# Patient Record
Sex: Male | Born: 1940 | Race: White | Hispanic: No | Marital: Married | Smoking: Former smoker
Health system: Southern US, Community
[De-identification: ages and names within clinical notes are randomized; demographics above are authoritative.]

## PROBLEM LIST (undated history)

## (undated) DIAGNOSIS — J45909 Unspecified asthma, uncomplicated: Secondary | ICD-10-CM

## (undated) DIAGNOSIS — K08109 Complete loss of teeth, unspecified cause, unspecified class: Secondary | ICD-10-CM

## (undated) DIAGNOSIS — N189 Chronic kidney disease, unspecified: Secondary | ICD-10-CM

## (undated) DIAGNOSIS — N183 Chronic kidney disease, stage 3 unspecified: Secondary | ICD-10-CM

## (undated) DIAGNOSIS — E119 Type 2 diabetes mellitus without complications: Secondary | ICD-10-CM

## (undated) DIAGNOSIS — Z8701 Personal history of pneumonia (recurrent): Secondary | ICD-10-CM

## (undated) DIAGNOSIS — M1990 Unspecified osteoarthritis, unspecified site: Secondary | ICD-10-CM

## (undated) DIAGNOSIS — R350 Frequency of micturition: Secondary | ICD-10-CM

## (undated) DIAGNOSIS — H9190 Unspecified hearing loss, unspecified ear: Secondary | ICD-10-CM

## (undated) DIAGNOSIS — K219 Gastro-esophageal reflux disease without esophagitis: Secondary | ICD-10-CM

## (undated) DIAGNOSIS — Z87442 Personal history of urinary calculi: Secondary | ICD-10-CM

## (undated) DIAGNOSIS — J449 Chronic obstructive pulmonary disease, unspecified: Secondary | ICD-10-CM

## (undated) DIAGNOSIS — R0602 Shortness of breath: Secondary | ICD-10-CM

## (undated) DIAGNOSIS — Z972 Presence of dental prosthetic device (complete) (partial): Secondary | ICD-10-CM

## (undated) DIAGNOSIS — I10 Essential (primary) hypertension: Secondary | ICD-10-CM

## (undated) DIAGNOSIS — H409 Unspecified glaucoma: Secondary | ICD-10-CM

## (undated) HISTORY — PX: JOINT REPLACEMENT: SHX530

---

## 1993-07-15 DIAGNOSIS — Z87442 Personal history of urinary calculi: Secondary | ICD-10-CM

## 1993-07-15 HISTORY — DX: Personal history of urinary calculi: Z87.442

## 2012-12-13 HISTORY — PX: KNEE ARTHROPLASTY: SHX992

## 2014-03-14 ENCOUNTER — Other Ambulatory Visit: Payer: Self-pay | Admitting: Orthopedic Surgery

## 2014-04-08 ENCOUNTER — Ambulatory Visit (HOSPITAL_COMMUNITY)
Admission: RE | Admit: 2014-04-08 | Discharge: 2014-04-08 | Disposition: A | Discharge status: Home / Self Care | Payer: Medicare Other | Source: Ambulatory Visit | Attending: Orthopedic Surgery | Admitting: Orthopedic Surgery

## 2014-04-08 ENCOUNTER — Encounter (HOSPITAL_COMMUNITY): Payer: Self-pay

## 2014-04-08 ENCOUNTER — Encounter (HOSPITAL_COMMUNITY)
Admission: RE | Admit: 2014-04-08 | Discharge: 2014-04-08 | Disposition: A | Discharge status: Home / Self Care | Payer: Medicare Other | Source: Ambulatory Visit | Attending: Orthopedic Surgery | Admitting: Orthopedic Surgery

## 2014-04-08 DIAGNOSIS — I10 Essential (primary) hypertension: Secondary | ICD-10-CM | POA: Diagnosis not present

## 2014-04-08 DIAGNOSIS — Z01818 Encounter for other preprocedural examination: Secondary | ICD-10-CM | POA: Insufficient documentation

## 2014-04-08 HISTORY — DX: Type 2 diabetes mellitus without complications: E11.9

## 2014-04-08 HISTORY — DX: Unspecified hearing loss, unspecified ear: H91.90

## 2014-04-08 HISTORY — DX: Personal history of pneumonia (recurrent): Z87.01

## 2014-04-08 HISTORY — DX: Chronic obstructive pulmonary disease, unspecified: J44.9

## 2014-04-08 HISTORY — DX: Essential (primary) hypertension: I10

## 2014-04-08 HISTORY — DX: Complete loss of teeth, unspecified cause, unspecified class: K08.109

## 2014-04-08 HISTORY — DX: Unspecified glaucoma: H40.9

## 2014-04-08 HISTORY — DX: Gastro-esophageal reflux disease without esophagitis: K21.9

## 2014-04-08 HISTORY — DX: Personal history of urinary calculi: Z87.442

## 2014-04-08 HISTORY — DX: Unspecified asthma, uncomplicated: J45.909

## 2014-04-08 HISTORY — DX: Chronic kidney disease, stage 3 (moderate): N18.3

## 2014-04-08 HISTORY — DX: Shortness of breath: R06.02

## 2014-04-08 HISTORY — DX: Chronic kidney disease, unspecified: N18.9

## 2014-04-08 HISTORY — DX: Complete loss of teeth, unspecified cause, unspecified class: Z97.2

## 2014-04-08 HISTORY — DX: Frequency of micturition: R35.0

## 2014-04-08 HISTORY — DX: Unspecified osteoarthritis, unspecified site: M19.90

## 2014-04-08 HISTORY — DX: Chronic kidney disease, stage 3 unspecified: N18.30

## 2014-04-08 LAB — CBC WITH DIFFERENTIAL/PLATELET
Basophils Absolute: 0 10*3/uL (ref 0.0–0.1)
Basophils Relative: 1 % (ref 0–1)
Eosinophils Absolute: 0.2 10*3/uL (ref 0.0–0.7)
Eosinophils Relative: 2 % (ref 0–5)
HEMATOCRIT: 40.7 % (ref 39.0–52.0)
HEMOGLOBIN: 13.3 g/dL (ref 13.0–17.0)
LYMPHS PCT: 26 % (ref 12–46)
Lymphs Abs: 2 10*3/uL (ref 0.7–4.0)
MCH: 29.4 pg (ref 26.0–34.0)
MCHC: 32.7 g/dL (ref 30.0–36.0)
MCV: 90 fL (ref 78.0–100.0)
MONO ABS: 0.6 10*3/uL (ref 0.1–1.0)
MONOS PCT: 7 % (ref 3–12)
Neutro Abs: 4.9 10*3/uL (ref 1.7–7.7)
Neutrophils Relative %: 64 % (ref 43–77)
Platelets: 274 10*3/uL (ref 150–400)
RBC: 4.52 MIL/uL (ref 4.22–5.81)
RDW: 12.6 % (ref 11.5–15.5)
WBC: 7.6 10*3/uL (ref 4.0–10.5)

## 2014-04-08 LAB — COMPREHENSIVE METABOLIC PANEL
ALT: 17 U/L (ref 0–53)
ANION GAP: 13 (ref 5–15)
AST: 19 U/L (ref 0–37)
Albumin: 3.4 g/dL — ABNORMAL LOW (ref 3.5–5.2)
Alkaline Phosphatase: 50 U/L (ref 39–117)
BILIRUBIN TOTAL: 0.3 mg/dL (ref 0.3–1.2)
BUN: 28 mg/dL — AB (ref 6–23)
CHLORIDE: 100 meq/L (ref 96–112)
CO2: 26 meq/L (ref 19–32)
CREATININE: 1.63 mg/dL — AB (ref 0.50–1.35)
Calcium: 9.9 mg/dL (ref 8.4–10.5)
GFR calc Af Amer: 47 mL/min — ABNORMAL LOW (ref >90)
GFR, EST NON AFRICAN AMERICAN: 40 mL/min — AB (ref >90)
GLUCOSE: 171 mg/dL — AB (ref 70–99)
Potassium: 5.4 mEq/L — ABNORMAL HIGH (ref 3.7–5.3)
Sodium: 139 mEq/L (ref 137–147)
Total Protein: 7.8 g/dL (ref 6.0–8.3)

## 2014-04-08 LAB — SURGICAL PCR SCREEN
MRSA, PCR: NEGATIVE
Staphylococcus aureus: NEGATIVE

## 2014-04-08 LAB — URINALYSIS, ROUTINE W REFLEX MICROSCOPIC
BILIRUBIN URINE: NEGATIVE
Glucose, UA: 100 mg/dL — AB
HGB URINE DIPSTICK: NEGATIVE
Ketones, ur: NEGATIVE mg/dL
Leukocytes, UA: NEGATIVE
Nitrite: NEGATIVE
SPECIFIC GRAVITY, URINE: 1.019 (ref 1.005–1.030)
UROBILINOGEN UA: 0.2 mg/dL (ref 0.0–1.0)
pH: 5.5 (ref 5.0–8.0)

## 2014-04-08 LAB — URINE MICROSCOPIC-ADD ON

## 2014-04-08 LAB — PROTIME-INR
INR: 1.01 (ref 0.00–1.49)
PROTHROMBIN TIME: 13.3 s (ref 11.6–15.2)

## 2014-04-08 LAB — APTT: APTT: 35 s (ref 24–37)

## 2014-04-08 NOTE — Progress Notes (Signed)
04/08/14 1155  OBSTRUCTIVE SLEEP APNEA  Have you ever been diagnosed with sleep apnea through a sleep study? No  Do you snore loudly (loud enough to be heard through closed doors)?  0  Do you often feel tired, fatigued, or sleepy during the daytime? 0  Has anyone observed you stop breathing during your sleep? 0  Do you have, or are you being treated for high blood pressure? 1  BMI more than 35 kg/m2? 0  Age over 72 years old? 1  Neck circumference greater than 40 cm/16 inches? 1  Gender: 1  Obstructive Sleep Apnea Score 4  Score 4 or greater  (Dr Lorelei Pont)

## 2014-04-08 NOTE — Pre-Procedure Instructions (Signed)
Seth Romero  04/08/2014   Your procedure is scheduled on:  Monday, October 5  Report to [facility] Admitting at 0530 AM.  Call this number if you have problems the morning of surgery: [phone]   Remember:   Do not eat food or drink liquids after midnight.Sunday night   Take these medicines the morning of surgery with A SIP OF WATER: amlodipine,hydralazine, metoprolol. Omeprazole, tramadol if needed, eye drops, use inhaler if needed   Do not wear jewelry, make-up or nail polish.  Do not wear lotions, powders, or perfumes. You may wear deodorant.  Do not shave 48 hours prior to surgery. Men may shave face and neck.  Do not bring valuables to the hospital.  [facility] is not responsible   for any belongings or valuables.               Contacts, dentures or bridgework may not be worn into surgery.  Leave suitcase in the car. After surgery it may be brought to your room.  For patients admitted to the hospital, discharge time is determined by your   treatment team.               Patients discharged the day of surgery will not be allowed to drive  home.  Name and phone number of your driver: Mary Dudzik, spouse, [phone]    Special Instructions: [facility] - Preparing for Surgery  Before surgery, you can play an important role.  Because skin is not sterile, your skin needs to be as free of germs as possible.  You can reduce the number of germs on you skin by washing with CHG (chlorahexidine gluconate) soap before surgery.  CHG is an antiseptic cleaner which kills germs and bonds with the skin to continue killing germs even after washing.  Please DO NOT use if you have an allergy to CHG or antibacterial soaps.  If your skin becomes reddened/irritated stop using the CHG and inform your nurse when you arrive at Short Stay.  Do not shave (including legs and underarms) for at least 48 hours prior to the first CHG shower.  You may shave your face.  Please follow these  instructions carefully:   1.  Shower with CHG Soap the night before surgery and the  morning of Surgery.  2.  If you choose to wash your hair, wash your hair first as usual with your   normal shampoo.  3.  After you shampoo, rinse your hair and body thoroughly to remove the Shampoo.  4.  Use CHG as you would any other liquid soap.  You can apply chg directly   to the skin and wash gently with scrungie or a clean washcloth.  5.  Apply the CHG Soap to your body ONLY FROM THE NECK DOWN.  Do not use on open wounds or open sores.  Avoid contact with your eyes,    ears, mouth and genitals (private parts).  Wash genitals (private parts)  with your normal soap.  6.  Wash thoroughly, paying special attention to the area where your surgery  will be performed.  7.  Thoroughly rinse your body with warm water from the neck down.  8.  DO NOT shower/wash with your normal soap after using and rinsing off    the CHG Soap.  9.  Pat yourself dry with a clean towel.            10 .  Wear clean pajamas.  11.  Place clean sheets on your bed the night of your first shower and do not sleep with pets.  Day of Surgery  Do not apply any lotions/deoderants the morning of surgery.  Please wear clean clothes to the hospital/surgery center.     Please read over the following fact sheets that you were given: Pain Booklet, Coughing and Deep Breathing, Total Joint Packet and Surgical Site Infection Prevention

## 2014-04-08 NOTE — Progress Notes (Signed)
EKG, Stress test requested from [facility] Records re : Stage 3 kidney disease requested from Dr Kinnie Scales in [place] Records from PCP, Dr Stanton Kidney requested  For medical clearance

## 2014-04-09 LAB — URINE CULTURE

## 2014-04-11 ENCOUNTER — Encounter (HOSPITAL_COMMUNITY): Payer: Self-pay

## 2014-04-11 NOTE — Progress Notes (Addendum)
Anesthesia Chart Review:  Patient is a 73 year old male scheduled for right TKA on 04/18/14 by Dr. Sherlean Foot.  History includes HTN, CKD stage III (Dr. Rise Paganini in [place]), former smoker (still uses snuff), DM2, arthritis, glaucoma, GERD, hearing impaired, nephrolithiasis, left TKA '14. PCP is Dr. Lorelei Pont. Last office notes are still pending, but I did receive signed clearance notes from Dr. Kinnie Scales and Dr. Leary Roca.  EKG on 04/08/14 showed: NSR, cannot rule out anterior infarct (age undetermined). Currently, no comparison EKGs are available.  Echo on 10/12/13 [facility] [place]) showed: Mild concentric LVH and normal systolic function, EF 55-60%.  Normal RV systolic function. Moderate pulmonary hypertension. No major valvular abnormalities noted (trace MR, trace-mild TR.   Carotid duplex on 10/12/13 [facility] [place]) showed: intimal thickening with < 50% stenosis in the bilateral internal carotid systems.  Bilateral vertebrals demonstrate antegrade flow.    CXR on 04/08/14 showed: Evidence of a degree of underlying emphysematous change. No edema or consolidation. Atherosclerotic change in aorta.  Preoperative labs noted.  K 5.4, BUN/Cr 28/1.63. Currently, no comparison labs are available, but patient with known CKD stage III--nephrology records are pending. Glucose 171 (medical clearance note indicates that his last A1C was 6.6). CBC with diff, PT/PTT WNL. Urine culture showed insignificant growth. He needs T&S on the day of surgery since this is a TKA.  I'll leave chart for nurse follow-up regarding PCP and nephrology records and ask them to have me review once received.  However, based on the two clearance notes and currently available records, I anticipate that he can proceed as planned.  Velna Ochs [facility] Short Stay Center/Anesthesiology Phone [phone] 04/11/2014 12:43 PM  Addendum: Records from Dr. Kinnie Scales reviewed.  BUN/CR were 30/1.68 on  03/02/14, so most recent results seen stable.  Velna Ochs [facility] Short Stay Center/Anesthesiology Phone [phone] 04/11/2014 6:32 PM

## 2014-04-17 MED ORDER — CHLORHEXIDINE GLUCONATE 4 % EX LIQD
60.0000 mL | Freq: Once | CUTANEOUS | Status: DC
  Filled 2014-04-17: qty 60

## 2014-04-17 MED ORDER — BUPIVACAINE LIPOSOME 1.3 % IJ SUSP
20.0000 mL | Freq: Once | INTRAMUSCULAR | Status: DC
  Filled 2014-04-17: qty 20

## 2014-04-17 MED ORDER — SODIUM CHLORIDE 0.9 % IV SOLN
1000.0000 mg | INTRAVENOUS | Status: AC
  Administered 2014-04-18: 1000 mg via INTRAVENOUS
  Filled 2014-04-17: qty 10

## 2014-04-17 MED ORDER — SODIUM CHLORIDE 0.9 % IV SOLN: INTRAVENOUS | Status: DC

## 2014-04-17 MED ORDER — CEFAZOLIN SODIUM-DEXTROSE 2-3 GM-% IV SOLR
2.0000 g | INTRAVENOUS | Status: AC
  Administered 2014-04-18: 2 g via INTRAVENOUS
  Filled 2014-04-17: qty 50

## 2014-04-18 ENCOUNTER — Encounter (HOSPITAL_COMMUNITY): Payer: Medicare Other | Admitting: Vascular Surgery

## 2014-04-18 ENCOUNTER — Inpatient Hospital Stay (HOSPITAL_COMMUNITY)
Admission: RE | Admit: 2014-04-18 | Discharge: 2014-04-19 | DRG: 470 | Disposition: A | Discharge status: Home / Self Care | Payer: Medicare Other | Source: Ambulatory Visit | Attending: Orthopedic Surgery | Admitting: Orthopedic Surgery

## 2014-04-18 ENCOUNTER — Encounter (HOSPITAL_COMMUNITY)
Admission: RE | Disposition: A | Discharge status: Home / Self Care | Payer: Self-pay | Source: Ambulatory Visit | Attending: Orthopedic Surgery

## 2014-04-18 ENCOUNTER — Encounter (HOSPITAL_COMMUNITY): Payer: Self-pay | Admitting: *Deleted

## 2014-04-18 ENCOUNTER — Inpatient Hospital Stay (HOSPITAL_COMMUNITY): Payer: Medicare Other | Admitting: Certified Registered Nurse Anesthetist

## 2014-04-18 DIAGNOSIS — H9190 Unspecified hearing loss, unspecified ear: Secondary | ICD-10-CM | POA: Diagnosis present

## 2014-04-18 DIAGNOSIS — M25561 Pain in right knee: Secondary | ICD-10-CM | POA: Diagnosis present

## 2014-04-18 DIAGNOSIS — Z96659 Presence of unspecified artificial knee joint: Secondary | ICD-10-CM

## 2014-04-18 DIAGNOSIS — Z87891 Personal history of nicotine dependence: Secondary | ICD-10-CM

## 2014-04-18 DIAGNOSIS — Z7982 Long term (current) use of aspirin: Secondary | ICD-10-CM

## 2014-04-18 DIAGNOSIS — N183 Chronic kidney disease, stage 3 (moderate): Secondary | ICD-10-CM | POA: Diagnosis not present

## 2014-04-18 DIAGNOSIS — M1990 Unspecified osteoarthritis, unspecified site: Secondary | ICD-10-CM | POA: Diagnosis not present

## 2014-04-18 DIAGNOSIS — K219 Gastro-esophageal reflux disease without esophagitis: Secondary | ICD-10-CM | POA: Diagnosis present

## 2014-04-18 DIAGNOSIS — E119 Type 2 diabetes mellitus without complications: Secondary | ICD-10-CM | POA: Diagnosis present

## 2014-04-18 DIAGNOSIS — M179 Osteoarthritis of knee, unspecified: Secondary | ICD-10-CM | POA: Diagnosis not present

## 2014-04-18 DIAGNOSIS — J449 Chronic obstructive pulmonary disease, unspecified: Secondary | ICD-10-CM | POA: Diagnosis present

## 2014-04-18 DIAGNOSIS — J45909 Unspecified asthma, uncomplicated: Secondary | ICD-10-CM | POA: Diagnosis present

## 2014-04-18 DIAGNOSIS — I129 Hypertensive chronic kidney disease with stage 1 through stage 4 chronic kidney disease, or unspecified chronic kidney disease: Secondary | ICD-10-CM | POA: Diagnosis not present

## 2014-04-18 DIAGNOSIS — Z96651 Presence of right artificial knee joint: Secondary | ICD-10-CM

## 2014-04-18 DIAGNOSIS — D62 Acute posthemorrhagic anemia: Secondary | ICD-10-CM | POA: Diagnosis present

## 2014-04-18 DIAGNOSIS — Z96652 Presence of left artificial knee joint: Secondary | ICD-10-CM | POA: Diagnosis present

## 2014-04-18 HISTORY — PX: TOTAL KNEE ARTHROPLASTY: SHX125

## 2014-04-18 LAB — CREATININE, SERUM
Creatinine, Ser: 1.67 mg/dL — ABNORMAL HIGH (ref 0.50–1.35)
GFR calc Af Amer: 46 mL/min — ABNORMAL LOW (ref >90)
GFR calc non Af Amer: 39 mL/min — ABNORMAL LOW (ref >90)

## 2014-04-18 LAB — GLUCOSE, CAPILLARY
GLUCOSE-CAPILLARY: 218 mg/dL — AB (ref 70–99)
Glucose-Capillary: 135 mg/dL — ABNORMAL HIGH (ref 70–99)
Glucose-Capillary: 170 mg/dL — ABNORMAL HIGH (ref 70–99)
Glucose-Capillary: 228 mg/dL — ABNORMAL HIGH (ref 70–99)
Glucose-Capillary: 253 mg/dL — ABNORMAL HIGH (ref 70–99)

## 2014-04-18 LAB — CBC
HEMATOCRIT: 35.4 % — AB (ref 39.0–52.0)
HEMOGLOBIN: 11.5 g/dL — AB (ref 13.0–17.0)
MCH: 29.1 pg (ref 26.0–34.0)
MCHC: 32.5 g/dL (ref 30.0–36.0)
MCV: 89.6 fL (ref 78.0–100.0)
Platelets: 213 10*3/uL (ref 150–400)
RBC: 3.95 MIL/uL — ABNORMAL LOW (ref 4.22–5.81)
RDW: 12.7 % (ref 11.5–15.5)
WBC: 7.2 10*3/uL (ref 4.0–10.5)

## 2014-04-18 LAB — HEMOGLOBIN A1C
Hgb A1c MFr Bld: 6.9 % — ABNORMAL HIGH (ref <5.7)
Mean Plasma Glucose: 151 mg/dL — ABNORMAL HIGH (ref <117)

## 2014-04-18 SURGERY — ARTHROPLASTY, KNEE, TOTAL
Anesthesia: General | Site: Knee | Laterality: Right

## 2014-04-18 MED ORDER — DOCUSATE SODIUM 100 MG PO CAPS
100.0000 mg | ORAL_CAPSULE | Freq: Two times a day (BID) | ORAL | Status: DC
  Administered 2014-04-18 – 2014-04-19 (×3): 100 mg via ORAL
  Filled 2014-04-18 (×4): qty 1

## 2014-04-18 MED ORDER — DEXAMETHASONE SODIUM PHOSPHATE 4 MG/ML IJ SOLN
INTRAMUSCULAR | Status: DC | PRN
  Administered 2014-04-18: 4 mg via INTRAVENOUS

## 2014-04-18 MED ORDER — EPHEDRINE SULFATE 50 MG/ML IJ SOLN
INTRAMUSCULAR | Status: DC | PRN
  Administered 2014-04-18 (×2): 5 mg via INTRAVENOUS
  Administered 2014-04-18: 10 mg via INTRAVENOUS

## 2014-04-18 MED ORDER — HYDROMORPHONE HCL 1 MG/ML IJ SOLN: 0.2500 mg | INTRAMUSCULAR | Status: DC | PRN

## 2014-04-18 MED ORDER — SENNOSIDES-DOCUSATE SODIUM 8.6-50 MG PO TABS: 1.0000 | ORAL_TABLET | Freq: Every evening | ORAL | Status: DC | PRN

## 2014-04-18 MED ORDER — ENOXAPARIN SODIUM 30 MG/0.3ML ~~LOC~~ SOLN
30.0000 mg | Freq: Two times a day (BID) | SUBCUTANEOUS | Status: DC
  Administered 2014-04-19: 30 mg via SUBCUTANEOUS
  Filled 2014-04-18 (×3): qty 0.3

## 2014-04-18 MED ORDER — CELECOXIB 200 MG PO CAPS
200.0000 mg | ORAL_CAPSULE | Freq: Two times a day (BID) | ORAL | Status: DC
  Administered 2014-04-18 – 2014-04-19 (×3): 200 mg via ORAL
  Filled 2014-04-18 (×5): qty 1

## 2014-04-18 MED ORDER — PROPOFOL 10 MG/ML IV BOLUS
INTRAVENOUS | Status: DC | PRN
  Administered 2014-04-18: 180 mg via INTRAVENOUS

## 2014-04-18 MED ORDER — GLIPIZIDE 10 MG PO TABS
10.0000 mg | ORAL_TABLET | Freq: Two times a day (BID) | ORAL | Status: DC
  Administered 2014-04-18 – 2014-04-19 (×3): 10 mg via ORAL
  Filled 2014-04-18 (×4): qty 1

## 2014-04-18 MED ORDER — HYDRALAZINE HCL 25 MG PO TABS
25.0000 mg | ORAL_TABLET | Freq: Two times a day (BID) | ORAL | Status: DC
  Administered 2014-04-18 – 2014-04-19 (×2): 25 mg via ORAL
  Filled 2014-04-18 (×4): qty 1

## 2014-04-18 MED ORDER — OXYCODONE-ACETAMINOPHEN 5-325 MG PO TABS: 1.0000 | ORAL_TABLET | Freq: Once | ORAL | Status: DC

## 2014-04-18 MED ORDER — FLEET ENEMA 7-19 GM/118ML RE ENEM: 1.0000 | ENEMA | Freq: Once | RECTAL | Status: AC | PRN

## 2014-04-18 MED ORDER — SODIUM CHLORIDE 0.9 % IV SOLN: INTRAVENOUS | Status: DC

## 2014-04-18 MED ORDER — METOCLOPRAMIDE HCL 5 MG/ML IJ SOLN: 5.0000 mg | Freq: Three times a day (TID) | INTRAMUSCULAR | Status: DC | PRN

## 2014-04-18 MED ORDER — MIDAZOLAM HCL 2 MG/2ML IJ SOLN
INTRAMUSCULAR | Status: AC
  Filled 2014-04-18: qty 2

## 2014-04-18 MED ORDER — ROCURONIUM BROMIDE 100 MG/10ML IV SOLN
INTRAVENOUS | Status: DC | PRN
  Administered 2014-04-18: 50 mg via INTRAVENOUS

## 2014-04-18 MED ORDER — BUPIVACAINE-EPINEPHRINE (PF) 0.5% -1:200000 IJ SOLN
INTRAMUSCULAR | Status: DC | PRN
  Administered 2014-04-18: 30 mL via PERINEURAL

## 2014-04-18 MED ORDER — OXYCODONE HCL 5 MG PO TABS
5.0000 mg | ORAL_TABLET | ORAL | Status: DC | PRN
  Administered 2014-04-18 – 2014-04-19 (×4): 10 mg via ORAL
  Filled 2014-04-18 (×4): qty 2

## 2014-04-18 MED ORDER — BUPIVACAINE-EPINEPHRINE 0.5% -1:200000 IJ SOLN
INTRAMUSCULAR | Status: DC | PRN
  Administered 2014-04-18: 20 mL

## 2014-04-18 MED ORDER — ONDANSETRON HCL 4 MG PO TABS: 4.0000 mg | ORAL_TABLET | Freq: Four times a day (QID) | ORAL | Status: DC | PRN

## 2014-04-18 MED ORDER — GLYCOPYRROLATE 0.2 MG/ML IJ SOLN
INTRAMUSCULAR | Status: DC | PRN
  Administered 2014-04-18: 0.2 mg via INTRAVENOUS
  Administered 2014-04-18: .8 mg via INTRAVENOUS

## 2014-04-18 MED ORDER — NEOSTIGMINE METHYLSULFATE 10 MG/10ML IV SOLN
INTRAVENOUS | Status: DC | PRN
  Administered 2014-04-18: 5 mg via INTRAVENOUS

## 2014-04-18 MED ORDER — LIDOCAINE HCL (CARDIAC) 20 MG/ML IV SOLN
INTRAVENOUS | Status: DC | PRN
  Administered 2014-04-18: 80 mg via INTRAVENOUS

## 2014-04-18 MED ORDER — OXYCODONE HCL ER 10 MG PO T12A
10.0000 mg | EXTENDED_RELEASE_TABLET | Freq: Two times a day (BID) | ORAL | Status: DC
  Administered 2014-04-18 – 2014-04-19 (×2): 10 mg via ORAL
  Filled 2014-04-18 (×2): qty 1

## 2014-04-18 MED ORDER — GLYCOPYRROLATE 0.2 MG/ML IJ SOLN
INTRAMUSCULAR | Status: AC
  Filled 2014-04-18: qty 4

## 2014-04-18 MED ORDER — PANTOPRAZOLE SODIUM 40 MG PO TBEC
40.0000 mg | DELAYED_RELEASE_TABLET | Freq: Every day | ORAL | Status: DC
  Administered 2014-04-19: 40 mg via ORAL
  Filled 2014-04-18: qty 1

## 2014-04-18 MED ORDER — BUPIVACAINE-EPINEPHRINE (PF) 0.5% -1:200000 IJ SOLN
INTRAMUSCULAR | Status: AC
Start: 2014-04-18 — End: 2014-04-18
  Filled 2014-04-18: qty 30

## 2014-04-18 MED ORDER — LACTATED RINGERS IV SOLN
INTRAVENOUS | Status: DC | PRN
  Administered 2014-04-18: 07:00:00 via INTRAVENOUS

## 2014-04-18 MED ORDER — DEXAMETHASONE SODIUM PHOSPHATE 4 MG/ML IJ SOLN
INTRAMUSCULAR | Status: AC
  Filled 2014-04-18: qty 1

## 2014-04-18 MED ORDER — OXYCODONE HCL 5 MG/5ML PO SOLN: 5.0000 mg | Freq: Once | ORAL | Status: DC | PRN

## 2014-04-18 MED ORDER — ACETAMINOPHEN 325 MG PO TABS: 650.0000 mg | ORAL_TABLET | Freq: Four times a day (QID) | ORAL | Status: DC | PRN

## 2014-04-18 MED ORDER — PROMETHAZINE HCL 25 MG/ML IJ SOLN: 6.2500 mg | INTRAMUSCULAR | Status: DC | PRN

## 2014-04-18 MED ORDER — ONDANSETRON HCL 4 MG/2ML IJ SOLN
INTRAMUSCULAR | Status: AC
  Filled 2014-04-18: qty 2

## 2014-04-18 MED ORDER — BUPIVACAINE LIPOSOME 1.3 % IJ SUSP
INTRAMUSCULAR | Status: DC | PRN
  Administered 2014-04-18: 20 mL

## 2014-04-18 MED ORDER — ACETAMINOPHEN 650 MG RE SUPP: 650.0000 mg | Freq: Four times a day (QID) | RECTAL | Status: DC | PRN

## 2014-04-18 MED ORDER — MENTHOL 3 MG MT LOZG: 1.0000 | LOZENGE | OROMUCOSAL | Status: DC | PRN

## 2014-04-18 MED ORDER — FENTANYL CITRATE 0.05 MG/ML IJ SOLN
INTRAMUSCULAR | Status: AC
  Filled 2014-04-18: qty 5

## 2014-04-18 MED ORDER — ONDANSETRON HCL 4 MG/2ML IJ SOLN
INTRAMUSCULAR | Status: DC | PRN
  Administered 2014-04-18: 4 mg via INTRAVENOUS

## 2014-04-18 MED ORDER — METOPROLOL TARTRATE 50 MG PO TABS
50.0000 mg | ORAL_TABLET | Freq: Two times a day (BID) | ORAL | Status: DC
  Administered 2014-04-18 – 2014-04-19 (×2): 50 mg via ORAL
  Filled 2014-04-18 (×3): qty 1

## 2014-04-18 MED ORDER — METHOCARBAMOL 1000 MG/10ML IJ SOLN
500.0000 mg | Freq: Four times a day (QID) | INTRAVENOUS | Status: DC | PRN
  Filled 2014-04-18: qty 5

## 2014-04-18 MED ORDER — OXYCODONE HCL 5 MG PO TABS: 5.0000 mg | ORAL_TABLET | ORAL | Status: DC | PRN

## 2014-04-18 MED ORDER — DIPHENHYDRAMINE HCL 12.5 MG/5ML PO ELIX: 12.5000 mg | ORAL_SOLUTION | ORAL | Status: DC | PRN

## 2014-04-18 MED ORDER — MIDAZOLAM HCL 5 MG/5ML IJ SOLN
INTRAMUSCULAR | Status: DC | PRN
  Administered 2014-04-18 (×2): 1 mg via INTRAVENOUS

## 2014-04-18 MED ORDER — PROPOFOL 10 MG/ML IV BOLUS
INTRAVENOUS | Status: AC
  Filled 2014-04-18: qty 20

## 2014-04-18 MED ORDER — GLIPIZIDE-METFORMIN HCL 5-500 MG PO TABS: 2.0000 | ORAL_TABLET | Freq: Two times a day (BID) | ORAL | Status: DC

## 2014-04-18 MED ORDER — ALBUTEROL SULFATE (2.5 MG/3ML) 0.083% IN NEBU: 3.0000 mL | INHALATION_SOLUTION | Freq: Four times a day (QID) | RESPIRATORY_TRACT | Status: DC | PRN

## 2014-04-18 MED ORDER — LIDOCAINE HCL (CARDIAC) 20 MG/ML IV SOLN
INTRAVENOUS | Status: AC
  Filled 2014-04-18: qty 5

## 2014-04-18 MED ORDER — LOSARTAN POTASSIUM 50 MG PO TABS
50.0000 mg | ORAL_TABLET | Freq: Every day | ORAL | Status: DC
  Administered 2014-04-18 – 2014-04-19 (×2): 50 mg via ORAL
  Filled 2014-04-18 (×2): qty 1

## 2014-04-18 MED ORDER — INSULIN ASPART 100 UNIT/ML ~~LOC~~ SOLN
0.0000 [IU] | Freq: Three times a day (TID) | SUBCUTANEOUS | Status: DC
  Administered 2014-04-18: 5 [IU] via SUBCUTANEOUS
  Administered 2014-04-18: 8 [IU] via SUBCUTANEOUS
  Administered 2014-04-19: 2 [IU] via SUBCUTANEOUS
  Administered 2014-04-19: 3 [IU] via SUBCUTANEOUS

## 2014-04-18 MED ORDER — METHOCARBAMOL 500 MG PO TABS
500.0000 mg | ORAL_TABLET | Freq: Four times a day (QID) | ORAL | Status: DC | PRN
  Administered 2014-04-19: 500 mg via ORAL
  Filled 2014-04-18: qty 1

## 2014-04-18 MED ORDER — BISACODYL 5 MG PO TBEC: 5.0000 mg | DELAYED_RELEASE_TABLET | Freq: Every day | ORAL | Status: DC | PRN

## 2014-04-18 MED ORDER — FENTANYL CITRATE 0.05 MG/ML IJ SOLN
INTRAMUSCULAR | Status: DC | PRN
  Administered 2014-04-18: 50 ug via INTRAVENOUS
  Administered 2014-04-18: 100 ug via INTRAVENOUS

## 2014-04-18 MED ORDER — MOMETASONE FURO-FORMOTEROL FUM 100-5 MCG/ACT IN AERO
2.0000 | INHALATION_SPRAY | Freq: Two times a day (BID) | RESPIRATORY_TRACT | Status: DC
  Administered 2014-04-18 – 2014-04-19 (×2): 2 via RESPIRATORY_TRACT
  Filled 2014-04-18 (×2): qty 8.8

## 2014-04-18 MED ORDER — CEFAZOLIN SODIUM 1-5 GM-% IV SOLN
1.0000 g | Freq: Four times a day (QID) | INTRAVENOUS | Status: AC
  Administered 2014-04-18 (×2): 1 g via INTRAVENOUS
  Filled 2014-04-18 (×2): qty 50

## 2014-04-18 MED ORDER — PHENOL 1.4 % MT LIQD: 1.0000 | OROMUCOSAL | Status: DC | PRN

## 2014-04-18 MED ORDER — ROCURONIUM BROMIDE 50 MG/5ML IV SOLN
INTRAVENOUS | Status: AC
  Filled 2014-04-18: qty 1

## 2014-04-18 MED ORDER — MONTELUKAST SODIUM 10 MG PO TABS
10.0000 mg | ORAL_TABLET | Freq: Every day | ORAL | Status: DC
  Administered 2014-04-18: 10 mg via ORAL
  Filled 2014-04-18 (×2): qty 1

## 2014-04-18 MED ORDER — AMLODIPINE BESYLATE 10 MG PO TABS
10.0000 mg | ORAL_TABLET | Freq: Every day | ORAL | Status: DC
  Administered 2014-04-19: 10 mg via ORAL
  Filled 2014-04-18: qty 1

## 2014-04-18 MED ORDER — ONDANSETRON HCL 4 MG/2ML IJ SOLN: 4.0000 mg | Freq: Four times a day (QID) | INTRAMUSCULAR | Status: DC | PRN

## 2014-04-18 MED ORDER — ZOLPIDEM TARTRATE 5 MG PO TABS: 5.0000 mg | ORAL_TABLET | Freq: Every evening | ORAL | Status: DC | PRN

## 2014-04-18 MED ORDER — HYDROMORPHONE HCL 1 MG/ML IJ SOLN: 1.0000 mg | INTRAMUSCULAR | Status: DC | PRN

## 2014-04-18 MED ORDER — OXYCODONE HCL 5 MG PO TABS: 5.0000 mg | ORAL_TABLET | Freq: Once | ORAL | Status: DC | PRN

## 2014-04-18 MED ORDER — SODIUM CHLORIDE 0.9 % IR SOLN
Status: DC | PRN
  Administered 2014-04-18: 1000 mL

## 2014-04-18 MED ORDER — METOCLOPRAMIDE HCL 5 MG PO TABS
5.0000 mg | ORAL_TABLET | Freq: Three times a day (TID) | ORAL | Status: DC | PRN
  Filled 2014-04-18: qty 2

## 2014-04-18 MED ORDER — ALUM & MAG HYDROXIDE-SIMETH 200-200-20 MG/5ML PO SUSP: 30.0000 mL | ORAL | Status: DC | PRN

## 2014-04-18 MED ORDER — FUROSEMIDE 40 MG PO TABS
40.0000 mg | ORAL_TABLET | Freq: Every day | ORAL | Status: DC
  Administered 2014-04-18 – 2014-04-19 (×2): 40 mg via ORAL
  Filled 2014-04-18 (×2): qty 1

## 2014-04-18 MED ORDER — NEOSTIGMINE METHYLSULFATE 10 MG/10ML IV SOLN
INTRAVENOUS | Status: AC
  Filled 2014-04-18: qty 1

## 2014-04-18 MED ORDER — INFLUENZA VAC SPLIT QUAD 0.5 ML IM SUSY
0.5000 mL | PREFILLED_SYRINGE | INTRAMUSCULAR | Status: AC
  Administered 2014-04-19: 0.5 mL via INTRAMUSCULAR
  Filled 2014-04-18: qty 0.5

## 2014-04-18 SURGICAL SUPPLY — 55 items
BANDAGE ESMARK 6X9 LF (GAUZE/BANDAGES/DRESSINGS) ×1 IMPLANT
BLADE SAGITTAL 13X1.27X60 (BLADE) ×2 IMPLANT
BLADE SAGITTAL 13X1.27X60MM (BLADE) ×1
BLADE SAW SGTL 83.5X18.5 (BLADE) ×3 IMPLANT
BNDG ESMARK 6X9 LF (GAUZE/BANDAGES/DRESSINGS) ×3
BOWL SMART MIX CTS (DISPOSABLE) ×3 IMPLANT
CAP POR TM CP VIT E LN CER HD ×3 IMPLANT
CEMENT BONE SIMPLEX SPEEDSET (Cement) ×6 IMPLANT
COVER SURGICAL LIGHT HANDLE (MISCELLANEOUS) ×3 IMPLANT
CUFF TOURNIQUET SINGLE 34IN LL (TOURNIQUET CUFF) ×3 IMPLANT
DRAPE EXTREMITY T 121X128X90 (DRAPE) ×3 IMPLANT
DRAPE INCISE IOBAN 66X45 STRL (DRAPES) ×6 IMPLANT
DRAPE PROXIMA HALF (DRAPES) ×3 IMPLANT
DRAPE U-SHAPE 47X51 STRL (DRAPES) ×3 IMPLANT
DRSG ADAPTIC 3X8 NADH LF (GAUZE/BANDAGES/DRESSINGS) ×3 IMPLANT
DRSG PAD ABDOMINAL 8X10 ST (GAUZE/BANDAGES/DRESSINGS) ×3 IMPLANT
DURAPREP 26ML APPLICATOR (WOUND CARE) ×6 IMPLANT
ELECT REM PT RETURN 9FT ADLT (ELECTROSURGICAL) ×3
ELECTRODE REM PT RTRN 9FT ADLT (ELECTROSURGICAL) ×1 IMPLANT
EVACUATOR 1/8 PVC DRAIN (DRAIN) ×3 IMPLANT
GAUZE SPONGE 4X4 12PLY STRL (GAUZE/BANDAGES/DRESSINGS) ×3 IMPLANT
GLOVE BIOGEL PI IND STRL 8.5 (GLOVE) ×2 IMPLANT
GLOVE BIOGEL PI INDICATOR 8.5 (GLOVE) ×4
GLOVE SURG ORTHO 8.0 STRL STRW (GLOVE) ×6 IMPLANT
GOWN STRL REUS W/ TWL LRG LVL3 (GOWN DISPOSABLE) ×2 IMPLANT
GOWN STRL REUS W/ TWL XL LVL3 (GOWN DISPOSABLE) ×2 IMPLANT
GOWN STRL REUS W/TWL LRG LVL3 (GOWN DISPOSABLE) ×4
GOWN STRL REUS W/TWL XL LVL3 (GOWN DISPOSABLE) ×4
HANDPIECE INTERPULSE COAX TIP (DISPOSABLE) ×2
HOOD PEEL AWAY FACE SHEILD DIS (HOOD) ×12 IMPLANT
KIT BASIN OR (CUSTOM PROCEDURE TRAY) ×3 IMPLANT
KIT ROOM TURNOVER OR (KITS) ×3 IMPLANT
MANIFOLD NEPTUNE II (INSTRUMENTS) ×3 IMPLANT
NEEDLE 22X1 1/2 (OR ONLY) (NEEDLE) ×6 IMPLANT
NS IRRIG 1000ML POUR BTL (IV SOLUTION) ×3 IMPLANT
PACK TOTAL JOINT (CUSTOM PROCEDURE TRAY) ×3 IMPLANT
PAD ABD 8X10 STRL (GAUZE/BANDAGES/DRESSINGS) ×3 IMPLANT
PAD ARMBOARD 7.5X6 YLW CONV (MISCELLANEOUS) ×6 IMPLANT
PADDING CAST ABS 6INX4YD NS (CAST SUPPLIES) ×2
PADDING CAST ABS COTTON 6X4 NS (CAST SUPPLIES) ×1 IMPLANT
PADDING CAST COTTON 6X4 STRL (CAST SUPPLIES) ×3 IMPLANT
SET HNDPC FAN SPRY TIP SCT (DISPOSABLE) ×1 IMPLANT
SPONGE GAUZE 4X4 12PLY STER LF (GAUZE/BANDAGES/DRESSINGS) ×3 IMPLANT
STAPLER VISISTAT 35W (STAPLE) ×3 IMPLANT
SUCTION FRAZIER TIP 10 FR DISP (SUCTIONS) ×3 IMPLANT
SUT BONE WAX W31G (SUTURE) ×3 IMPLANT
SUT VIC AB 0 CTB1 27 (SUTURE) ×6 IMPLANT
SUT VIC AB 1 CT1 27 (SUTURE) ×4
SUT VIC AB 1 CT1 27XBRD ANBCTR (SUTURE) ×2 IMPLANT
SUT VIC AB 2-0 CT1 27 (SUTURE) ×4
SUT VIC AB 2-0 CT1 TAPERPNT 27 (SUTURE) ×2 IMPLANT
SYR 20CC LL (SYRINGE) ×6 IMPLANT
TOWEL OR 17X24 6PK STRL BLUE (TOWEL DISPOSABLE) ×3 IMPLANT
TOWEL OR 17X26 10 PK STRL BLUE (TOWEL DISPOSABLE) ×3 IMPLANT
WATER STERILE IRR 1000ML POUR (IV SOLUTION) ×6 IMPLANT

## 2014-04-18 NOTE — Anesthesia Procedure Notes (Addendum)
Anesthesia Regional Block:  Adductor canal block  Pre-Anesthetic Checklist: ,, timeout performed, Correct Patient, Correct Site, Correct Laterality, Correct Procedure, Correct Position, site marked, Risks and benefits discussed,  Surgical consent,  Pre-op evaluation,  At surgeon's request and post-op pain management  Laterality: Right  Prep: chloraprep       Needles:  Injection technique: Single-shot  Needle Type: Echogenic Needle     Needle Length: 9cm 9 cm Needle Gauge: 21 and 21 G    Additional Needles:  Procedures: ultrasound guided (picture in chart) Adductor canal block Narrative:  Start time: 04/18/2014 7:03 AM End time: 04/18/2014 7:15 AM Injection made incrementally with aspirations every 5 mL.  Performed by: Personally  Anesthesiologist: Dr Chaney MallingHodierne  Additional Notes: Pt tolerated the procedure well.   Procedure Name: Intubation Date/Time: 04/18/2014 7:45 AM Performed by: Reine JustFLOWERS, Sparkle Aube T Pre-anesthesia Checklist: Patient identified, Emergency Drugs available, Suction available, Patient being monitored and Timeout performed Patient Re-evaluated:Patient Re-evaluated prior to inductionOxygen Delivery Method: Circle system utilized and Simple face mask Preoxygenation: Pre-oxygenation with 100% oxygen Intubation Type: IV induction Ventilation: Mask ventilation with difficulty, Two handed mask ventilation required and Oral airway inserted - appropriate to patient size Laryngoscope Size: Mac and 3 Grade View: Grade I Tube type: Oral Tube size: 7.5 mm Number of attempts: 1 Airway Equipment and Method: Patient positioned with wedge pillow and Stylet Placement Confirmation: ETT inserted through vocal cords under direct vision,  positive ETCO2 and breath sounds checked- equal and bilateral Secured at: 23 cm Tube secured with: Tape Dental Injury: Teeth and Oropharynx as per pre-operative assessment

## 2014-04-18 NOTE — Transfer of Care (Signed)
Immediate Anesthesia Transfer of Care Note  Patient: Seth Romero  Procedure(s) Performed: Procedure(s): RIGHT TOTAL KNEE ARTHROPLASTY (Right)  Patient Location: PACU  Anesthesia Type:GA combined with regional for post-op pain  Level of Consciousness: awake and alert   Airway & Oxygen Therapy: Patient Spontanous Breathing and Patient connected to face mask oxygen  Post-op Assessment: Report given to PACU RN, Post -op Vital signs reviewed and stable and Patient moving all extremities X 4  Post vital signs: Reviewed and stable  Complications: No apparent anesthesia complications

## 2014-04-18 NOTE — Care Management Note (Deleted)
CARE MANAGEMENT NOTE 04/18/2014  Patient:  Seth Romero,Seth Romero   Account Number:  [id number]  Date Initiated:  04/18/2014  Documentation initiated by:  Vance PeperBRADY,Juliette Standre  Subjective/Objective Assessment:   73 yr old male admitted with DJD of right total knee. Patient had right total knee arthroplasty.     Action/Plan:   PT/OT Eval.  [facility], no changes.CM will continue to monitor.   Anticipated DC Date:  04/20/2014   Anticipated DC Plan:  HOME W HOME HEALTH SERVICES      DC Planning Services  CM consult      [facility]  [facility]   Choice offered to / List presented to:          [facility]  [facility]      [facility]   Status of service:  In process, will continue to follow Medicare Important Message given?   (If response is "NO", the following Medicare IM given date fields will be blank) Date Medicare IM given:   Medicare IM given by:   Date Additional Medicare IM given:   Additional Medicare IM given by:    Discharge Disposition:    Per UR Regulation:  Reviewed for med. necessity/level of care/duration of stay

## 2014-04-18 NOTE — Evaluation (Signed)
Physical Therapy Evaluation Patient Details Name: Seth Romero MRN: [id number] DOB: 21-Mar-1941 Today's Date: 04/18/2014   History of Present Illness  Pt s/p R TKA  Clinical Impression  Pt moving exceptionally well POD#0. Pt and spouse educated for precaution, WBAT, HEP, transfers and gait with home equipment delivered to room. Pt will benefit from acute therapy to maximize mobility, function, strength, ROM and gait to decrease burden of care.     Follow Up Recommendations Home health PT    Equipment Recommendations  None recommended by PT    Recommendations for Other Services OT consult     Precautions / Restrictions Precautions Precautions: Knee Restrictions Weight Bearing Restrictions: Yes RLE Weight Bearing: Weight bearing as tolerated      Mobility  Bed Mobility Overal bed mobility: Needs Assistance Bed Mobility: Supine to Sit     Supine to sit: Min assist     General bed mobility comments: cues for sequence with min assist of RLE and assist of rail  Transfers Overall transfer level: Needs assistance   Transfers: Sit to/from Stand Sit to Stand: Min assist         General transfer comment: cues for hand placement and sequence  Ambulation/Gait Ambulation/Gait assistance: Min guard Ambulation Distance (Feet): 60 Feet Assistive device: Rolling walker (2 wheeled) Gait Pattern/deviations: Step-to pattern;Antalgic;Decreased stance time - right   Gait velocity interpretation: Below normal speed for age/gender    Stairs            Wheelchair Mobility    Modified Rankin (Stroke Patients Only)       Balance                                             Pertinent Vitals/Pain Pain Assessment: No/denies pain    Home Living Family/patient expects to be discharged to:: Private residence Living Arrangements: Spouse/significant other Available Help at Discharge: Family;Available 24 hours/day Type of Home: House Home Access:  Stairs to enter   Entergy CorporationEntrance Stairs-Number of Steps: 1 Home Layout: One level Home Equipment: Walker - 2 wheels;Bedside commode (equipment delivered to room)      Prior Function Level of Independence: Independent               Hand Dominance        Extremity/Trunk Assessment   Upper Extremity Assessment: Overall WFL for tasks assessed           Lower Extremity Assessment: RLE deficits/detail RLE Deficits / Details: decreased ROM and strength as expected post op    Cervical / Trunk Assessment: Normal  Communication   Communication: No difficulties;Other (comment) (wearing hearing aids)  Cognition Arousal/Alertness: Awake/alert Behavior During Therapy: WFL for tasks assessed/performed Overall Cognitive Status: Within Functional Limits for tasks assessed                      General Comments      Exercises Total Joint Exercises Ankle Circles/Pumps: AROM;Both;10 reps;Seated Heel Slides: AROM;Seated;Right;10 reps Long Arc Quad: AROM;Seated;Right;10 reps      Assessment/Plan    PT Assessment Patient needs continued PT services  PT Diagnosis Difficulty walking;Acute pain   PT Problem List Decreased strength;Decreased range of motion;Decreased activity tolerance;Decreased mobility;Pain;Decreased knowledge of use of DME  PT Treatment Interventions DME instruction;Gait training;Stair training;Functional mobility training;Therapeutic activities;Therapeutic exercise;Patient/family education   PT Goals (Current goals can be found in the Care  Plan section) Acute Rehab PT Goals Patient Stated Goal: walk without pain PT Goal Formulation: With patient/family Time For Goal Achievement: 04/25/14 Potential to Achieve Goals: Good    Frequency 7X/week   Barriers to discharge        Co-evaluation               End of Session   Activity Tolerance: Patient tolerated treatment well Patient left: in chair;with call bell/phone within reach;with  family/visitor present Nurse Communication: Mobility status         Time: 1610-9604 PT Time Calculation (min): 22 min   Charges:   PT Evaluation $Initial PT Evaluation Tier I: 1 Procedure PT Treatments $Gait Training: 8-22 mins   PT G CodesDelorse Lek 04/18/2014, 2:31 PM  Delaney Meigs, PT [phone]

## 2014-04-18 NOTE — Progress Notes (Signed)
PHARMACIST - PHYSICIAN COMMUNICATION DR:  Sherlean FootLucey CONCERNING:  METFORMIN SAFE ADMINISTRATION POLICY  RECOMMENDATION: Metformin has been placed on DISCONTINUE (rejected order) STATUS and should be reordered only after any of the conditions below are ruled out.  Current safety recommendations include avoiding metformin for a minimum of 48 hours after the patient's exposure to intravenous contrast media.  DESCRIPTION:  The Pharmacy Committee has adopted a policy that restricts the use of metformin in hospitalized patients until all the contraindications to administration have been ruled out. Specific contraindications are: []  Serum creatinine ? 1.5 for males [x]  Serum creatinine ? 1.4 for females []  Shock, acute MI, sepsis, hypoxemia, dehydration []  Planned administration of intravenous iodinated contrast media []  Heart Failure patients with low EF []  Acute or chronic metabolic acidosis (including DKA)     Loura BackJennifer Los Arcos, 17[address] Rainbow BoulevardPharm.D., BCPS Clinical Pharmacist Pager: [phone] 04/18/2014 11:17 AM

## 2014-04-18 NOTE — H&P (Signed)
Seth Romero MRN:  [id number] DOB/SEX:  21-Mar-1941/male  CHIEF COMPLAINT:  Painful right Knee  HISTORY: Patient is a 73 y.o. male presented with a history of pain in the right knee. Onset of symptoms was gradual starting several years ago with gradually worsening course since that time. Prior procedures on the knee include none. Patient has been treated conservatively with over-the-counter NSAIDs and activity modification. Patient currently rates pain in the knee at 10 out of 10 with activity. There is pain at night.  PAST MEDICAL HISTORY: There are no active problems to display for this patient.  Past Medical History  Diagnosis Date  . Hypertension   . Asthma   . COPD (chronic obstructive pulmonary disease)   . Diabetes mellitus without complication   . Chronic kidney disease   . Arthritis   . Glaucoma   . Kidney disease, chronic, stage III (GFR 30-59 ml/min)     [place]  . Full dentures   . Urination frequency   . GERD (gastroesophageal reflux disease)   . Shortness of breath   . History of pneumonia     10 years ago  . Diabetes mellitus type 2, noninsulin dependent     x 10 years  . Hearing impaired person     wears hearing aides  . History of kidney stones 1995   Past Surgical History  Procedure Laterality Date  . Joint replacement    . Knee arthroplasty Left 12/2012     MEDICATIONS:   Prescriptions prior to admission  Medication Sig Dispense Refill  . albuterol (PROVENTIL HFA;VENTOLIN HFA) 108 (90 BASE) MCG/ACT inhaler Inhale 1 puff into the lungs every 6 (six) hours as needed for wheezing or shortness of breath.      Marland Kitchen. amLODipine (NORVASC) 10 MG tablet Take 10 mg by mouth daily.      Marland Kitchen. aspirin EC 81 MG tablet Take 81 mg by mouth daily.      . Brinzolamide-Brimonidine (SIMBRINZA) 1-0.2 % SUSP Place 1 drop into both eyes 3 (three) times daily.      . cholecalciferol (VITAMIN D) 1000 UNITS tablet Take 2,000 Units by mouth daily.       . Fluticasone-Salmeterol (ADVAIR) 250-50 MCG/DOSE AEPB Inhale 1 puff into the lungs daily.      . furosemide (LASIX) 40 MG tablet Take 40 mg by mouth daily.      Marland Kitchen. glipiZIDE-metformin (METAGLIP) 5-500 MG per tablet Take 2 tablets by mouth 2 (two) times daily before a meal.      . hydrALAZINE (APRESOLINE) 25 MG tablet Take 25 mg by mouth 2 (two) times daily.      Marland Kitchen. losartan (COZAAR) 50 MG tablet Take 50 mg by mouth daily.      . magnesium oxide (MAG-OX) 400 MG tablet Take 400 mg by mouth 2 (two) times daily.      . metoprolol (LOPRESSOR) 50 MG tablet Take 50 mg by mouth 2 (two) times daily.      . montelukast (SINGULAIR) 10 MG tablet Take 10 mg by mouth at bedtime.      Marland Kitchen. omeprazole (PRILOSEC) 20 MG capsule Take 20 mg by mouth daily.      . traMADol (ULTRAM) 50 MG tablet Take 50 mg by mouth daily.        ALLERGIES:  No Known Allergies  REVIEW OF SYSTEMS:  Pertinent items are noted in HPI.   FAMILY HISTORY:  History reviewed. No pertinent family history.  SOCIAL HISTORY:  History  Substance Use Topics  . Smoking status: Former Smoker -- 1.00 packs/day for 30 years    Types: Cigarettes    Quit date: 03/29/1994  . Smokeless tobacco: Current User    Types: Snuff  . Alcohol Use: 1.2 oz/week    2 Cans of beer per week     Comment: no beer for 2 months     EXAMINATION:  Vital signs in last 24 hours: Temp:  [98.7 F (37.1 C)] 98.7 F (37.1 C) (10/05 0620) Pulse Rate:  [63] 63 (10/05 0620) Resp:  [20] 20 (10/05 0620) BP: (158)/(52) 158/52 mmHg (10/05 0620) SpO2:  [94 %] 94 % (10/05 0620) Weight:  [99.791 kg (220 lb)] 99.791 kg (220 lb) (10/05 0620)  General appearance: alert, cooperative and no distress Lungs: clear to auscultation bilaterally Heart: regular rate and rhythm, S1, S2 normal, no murmur, click, rub or gallop Abdomen: soft, non-tender; bowel sounds normal; no masses,  no organomegaly Extremities: extremities normal, atraumatic, no cyanosis or edema and Homans  sign is negative, no sign of DVT Pulses: 2+ and symmetric Skin: Skin color, texture, turgor normal. No rashes or lesions Neurologic: Alert and oriented X 3, normal strength and tone. Normal symmetric reflexes. Normal coordination and gait  Musculoskeletal:  ROM 0-110, Ligaments intact,  Imaging Review Plain radiographs demonstrate severe degenerative joint disease of the right knee. The overall alignment is mild varus. The bone quality appears to be good for age and reported activity level.  Assessment/Plan: End stage arthritis, right knee   The patient history, physical examination and imaging studies are consistent with advanced degenerative joint disease of the right knee. The patient has failed conservative treatment.  The clearance notes were reviewed.  After discussion with the patient it was felt that Total Knee Replacement was indicated. The procedure,  risks, and benefits of total knee arthroplasty were presented and reviewed. The risks including but not limited to aseptic loosening, infection, blood clots, vascular injury, stiffness, patella tracking problems complications among others were discussed. The patient acknowledged the explanation, agreed to proceed with the plan.  Seth Romero 04/18/2014, 6:40 AM

## 2014-04-18 NOTE — Progress Notes (Signed)
Utilization review completed.  

## 2014-04-18 NOTE — Anesthesia Preprocedure Evaluation (Signed)
Anesthesia Evaluation  Patient identified by MRN, date of birth, ID band Patient awake    Reviewed: Allergy & Precautions, H&P , NPO status , Patient's Chart, lab work & pertinent test results  Airway Mallampati: II      Dental   Pulmonary shortness of breath, asthma , former smoker,          Cardiovascular hypertension, Rhythm:Regular Rate:Normal     Neuro/Psych    GI/Hepatic GERD-  ,  Endo/Other  diabetesMorbid obesity  Renal/GU Renal InsufficiencyRenal disease     Musculoskeletal  (+) Arthritis -,   Abdominal   Peds  Hematology   Anesthesia Other Findings   Reproductive/Obstetrics                           Anesthesia Physical Anesthesia Plan  ASA: III  Anesthesia Plan: General   Post-op Pain Management:    Induction: Intravenous  Airway Management Planned: Oral ETT  Additional Equipment:   Intra-op Plan:   Post-operative Plan: Extubation in OR  Informed Consent: I have reviewed the patients History and Physical, chart, labs and discussed the procedure including the risks, benefits and alternatives for the proposed anesthesia with the patient or authorized representative who has indicated his/her understanding and acceptance.     Plan Discussed with: CRNA and Surgeon  Anesthesia Plan Comments:         Anesthesia Quick Evaluation

## 2014-04-18 NOTE — Care Management Note (Signed)
CARE MANAGEMENT NOTE 04/18/2014  Patient:  Seth Romero,Seth Romero   Account Number:  [id number]  Date Initiated:  04/18/2014  Documentation initiated by:  Shady Bradish  Subjective/Objective Assessment:   73 yr old male admitted with DJD of right total knee. Patient had right total knee arthroplasty.     Action/Plan:   PT/OT Eval.  Patient preoperatively setup with [facility] HH, no changes.CM will continue to monitor.   Anticipated DC Date:  04/20/2014   Anticipated DC Plan:  HOME W HOME HEALTH SERVICES      DC Planning Services  CM consult      PAC Choice  HOME HEALTH   Choice offered to / List presented to:          HH arranged  HH-2 PT      HH agency  [facility]   Status of service:  In process, will continue to follow Medicare Important Message given?   (If response is "NO", the following Medicare IM given date fields will be blank) Date Medicare IM given:   Medicare IM given by:   Date Additional Medicare IM given:   Additional Medicare IM given by:    Discharge Disposition:    Per UR Regulation:  Reviewed for med. necessity/level of care/duration of stay  

## 2014-04-18 NOTE — Progress Notes (Signed)
Orthopedic Tech Progress Note Patient Details:  Seth Romero 1940-08-11 [id number] CPM applied to RLE with appropriate settings. OHF applied to bed. Footsie roll provided. CPM Right Knee CPM Right Knee: On Right Knee Flexion (Degrees): 90 Right Knee Extension (Degrees): 0   [name] 04/18/2014, 10:37 AM

## 2014-04-19 ENCOUNTER — Encounter (HOSPITAL_COMMUNITY): Payer: Self-pay | Admitting: Orthopedic Surgery

## 2014-04-19 DIAGNOSIS — M1990 Unspecified osteoarthritis, unspecified site: Secondary | ICD-10-CM | POA: Diagnosis not present

## 2014-04-19 LAB — GLUCOSE, CAPILLARY
Glucose-Capillary: 116 mg/dL — ABNORMAL HIGH (ref 70–99)
Glucose-Capillary: 151 mg/dL — ABNORMAL HIGH (ref 70–99)
Glucose-Capillary: 136 mg/dL — ABNORMAL HIGH (ref 70–99)

## 2014-04-19 LAB — CBC
HEMATOCRIT: 29.6 % — AB (ref 39.0–52.0)
HEMOGLOBIN: 9.9 g/dL — AB (ref 13.0–17.0)
MCH: 29.6 pg (ref 26.0–34.0)
MCHC: 33.4 g/dL (ref 30.0–36.0)
MCV: 88.6 fL (ref 78.0–100.0)
Platelets: 233 10*3/uL (ref 150–400)
RBC: 3.34 MIL/uL — AB (ref 4.22–5.81)
RDW: 12.6 % (ref 11.5–15.5)
WBC: 8.7 10*3/uL (ref 4.0–10.5)

## 2014-04-19 LAB — BASIC METABOLIC PANEL
ANION GAP: 13 (ref 5–15)
BUN: 30 mg/dL — ABNORMAL HIGH (ref 6–23)
CALCIUM: 8.4 mg/dL (ref 8.4–10.5)
CHLORIDE: 97 meq/L (ref 96–112)
CO2: 25 meq/L (ref 19–32)
CREATININE: 1.85 mg/dL — AB (ref 0.50–1.35)
GFR calc non Af Amer: 35 mL/min — ABNORMAL LOW (ref >90)
GFR, EST AFRICAN AMERICAN: 40 mL/min — AB (ref >90)
Glucose, Bld: 116 mg/dL — ABNORMAL HIGH (ref 70–99)
Potassium: 4.9 mEq/L (ref 3.7–5.3)
SODIUM: 135 meq/L — AB (ref 137–147)

## 2014-04-19 MED ORDER — METHOCARBAMOL 500 MG PO TABS: 500.0000 mg | ORAL_TABLET | Freq: Four times a day (QID) | ORAL | Status: DC | PRN

## 2014-04-19 MED ORDER — ASPIRIN EC 325 MG PO TBEC: 325.0000 mg | DELAYED_RELEASE_TABLET | Freq: Every day | ORAL | Status: DC

## 2014-04-19 MED ORDER — OXYCODONE HCL 5 MG PO TABS: 5.0000 mg | ORAL_TABLET | ORAL | Status: AC | PRN

## 2014-04-19 MED ORDER — METHOCARBAMOL 500 MG PO TABS: 500.0000 mg | ORAL_TABLET | Freq: Four times a day (QID) | ORAL | Status: AC | PRN

## 2014-04-19 MED ORDER — ASPIRIN EC 325 MG PO TBEC: 325.0000 mg | DELAYED_RELEASE_TABLET | Freq: Every day | ORAL | Status: AC

## 2014-04-19 MED ORDER — OXYCODONE HCL 5 MG PO TABS: 5.0000 mg | ORAL_TABLET | ORAL | Status: DC | PRN

## 2014-04-19 NOTE — Evaluation (Signed)
Occupational Therapy Evaluation Patient Details Name: Seth Romero MRN: [id number] DOB: [birth year] Today's Date: 04/19/2014    History of Present Illness Pt s/p R TKA   Clinical Impression   Pt admitted with the above diagnoses and seen for acute OT evaluation. PTA pt was independent with ADLs. Pt currently at setup-min guard level with ADLs. Pt's wife available to assist 24 hour. Pt with h/o L knee surgery and familiar with ADL techniques.     Follow Up Recommendations  Supervision/Assistance - 24 hour;No OT follow up    Equipment Recommendations  None recommended by OT;Other (comment) (pt has 3n1)    Recommendations for Other Services       Precautions / Restrictions Precautions Precautions: Knee Precaution Comments: reviewed precautions Restrictions Weight Bearing Restrictions: Yes RLE Weight Bearing: Weight bearing as tolerated      Mobility Bed Mobility               General bed mobility comments: pt in recliner  Transfers Overall transfer level: Needs assistance Equipment used: Rolling walker (2 wheeled) Transfers: Sit to/from Stand Sit to Stand: Supervision         General transfer comment: cues for hand placement    Balance                                            ADL Overall ADL's : Needs assistance/impaired Eating/Feeding: Set up;Sitting   Grooming: Set up;Sitting;Standing   Upper Body Bathing: Set up;Sitting   Lower Body Bathing: Min guard;With adaptive equipment;Sit to/from stand   Upper Body Dressing : Set up;Sitting   Lower Body Dressing: Min guard;With adaptive equipment;Sit to/from stand   Toilet Transfer: Min guard;Ambulation;RW (3n1 over toilet)   Toileting- Clothing Manipulation and Hygiene: Min guard;Sit to/from stand   Tub/ Shower Transfer: Min guard;Ambulation;3 in 1;Rolling walker   Functional mobility during ADLs: Min guard;Rolling walker General ADL Comments: Educated pt and spouse on  techniques and AE for safe completion of ADLs with knee precautions.     Vision                     Perception     Praxis      Pertinent Vitals/Pain Pain Assessment: No/denies pain     Hand Dominance     Extremity/Trunk Assessment Upper Extremity Assessment Upper Extremity Assessment: Overall WFL for tasks assessed   Lower Extremity Assessment Lower Extremity Assessment: Defer to PT evaluation       Communication Communication Communication: No difficulties;Other (comment)   Cognition Arousal/Alertness: Awake/alert Behavior During Therapy: WFL for tasks assessed/performed Overall Cognitive Status: Within Functional Limits for tasks assessed                     General Comments       Exercises       Shoulder Instructions      Home Living Family/patient expects to be discharged to:: Private residence Living Arrangements: Spouse/significant other Available Help at Discharge: Family;Available 24 hours/day Type of Home: House Home Access: Stairs to enter Entergy CorporationEntrance Stairs-Number of Steps: 1   Home Layout: One level     Bathroom Shower/Tub: Producer, television/film/videoWalk-in shower   Bathroom Toilet: Standard     Home Equipment: Environmental consultantWalker - 2 wheels;Bedside commode          Prior Functioning/Environment Level of Independence: Independent  OT Diagnosis: Acute pain   OT Problem List: Pain;Impaired balance (sitting and/or standing)   OT Treatment/Interventions:      OT Goals(Current goals can be found in the care plan section)    OT Frequency:     Barriers to D/C:            Co-evaluation              End of Session Equipment Utilized During Treatment: Gait belt;Rolling walker CPM Right Knee Additional Comments: zero knee applied  Activity Tolerance: Patient tolerated treatment well Patient left: in chair;with call bell/phone within reach;with family/visitor present   Time: 1349-1403 OT Time Calculation (min): 14 min Charges:  OT  General Charges $OT Visit: 1 Procedure OT Evaluation $Initial OT Evaluation Tier I: 1 Procedure OT Treatments $Self Care/Home Management : 8-22 mins G-Codes:    Pilar Grammes 04/27/2014, 3:18 PM

## 2014-04-19 NOTE — Discharge Instructions (Signed)
Diet: As you were doing prior to hospitalization  ° °Activity:  Increase activity slowly as tolerated  °                No lifting or driving for 6 weeks ° °Shower:  May shower without a dressing once there is no drainage from your wound. °                Do NOT wash over the wound. °                °Dressing:  You may change your dressing on Wednesday °                   Then change the dressing daily with sterile 4"x4"s gauze dressing  °                   And TED hose for knees. ° °Weight Bearing:  Weight bearing as tolerated as taught in physical therapy.  Use a                                walker or Crutches as instructed. ° °To prevent constipation: you may use a stool softener such as - °              Colace ( over the counter) 100 mg by mouth twice a day  °              Drink plenty of fluids ( prune juice may be helpful) and high fiber foods °               Miralax ( over the counter) for constipation as needed.   ° °Precautions:  If you experience chest pain or shortness of breath - call 911 immediately               For transfer to the hospital emergency department!! °              If you develop a fever greater that 101 F, purulent drainage from wound,                             increased redness or drainage from wound, or calf pain -- Call the office. ° °Follow- Up Appointment:  Please call for an appointment to be seen on 05/03/14 °                                             [place] office:  [phone] °           [address] [place],  [address] °               ° ° °

## 2014-04-19 NOTE — Op Note (Signed)
TOTAL KNEE REPLACEMENT OPERATIVE NOTE:  04/18/2014  4:30 PM  PATIENT:  Seth Romero  73 y.o. male  PRE-OPERATIVE DIAGNOSIS:  osteoarthritis right knee  POST-OPERATIVE DIAGNOSIS:  osteoarthritis right knee  PROCEDURE:  Procedure(s): RIGHT TOTAL KNEE ARTHROPLASTY  SURGEON:  Surgeon(s): Dannielle HuhSteve Sidnee Gambrill, MD  PHYSICIAN ASSISTANT: Altamese CabalMaurice Jones, [facility]  ANESTHESIA:   general  DRAINS: Hemovac  SPECIMEN: None  COUNTS:  Correct  TOURNIQUET:   Total Tourniquet Time Documented: Thigh (Right) - 49 minutes Total: Thigh (Right) - 49 minutes   DICTATION:  Indication for procedure:    The patient is a 73 y.o. male who has failed conservative treatment for osteoarthritis right knee.  Informed consent was obtained prior to anesthesia. The risks versus benefits of the operation were explain and in a way the patient can, and did, understand.   On the implant demand matching protocol, this patient scored 10.  Therefore, this patient was not receive a polyethylene insert with vitamin E which is a high demand implant.  Description of procedure:     The patient was taken to the operating room and placed under anesthesia.  The patient was positioned in the usual fashion taking care that all body parts were adequately padded and/or protected.  I foley catheter was not placed.  A tourniquet was applied and the leg prepped and draped in the usual sterile fashion.  The extremity was exsanguinated with the esmarch and tourniquet inflated to 350 mmHg.  Pre-operative range of motion was normal.  The knee was in 5 degree of mild varus.  A midline incision approximately 6-7 inches long was made with a #10 blade.  A new blade was used to make a parapatellar arthrotomy going 2-3 cm into the quadriceps tendon, over the patella, and alongside the medial aspect of the patellar tendon.  A synovectomy was then performed with the #10 blade and forceps. I then elevated the deep MCL off the medial tibial metaphysis  subperiosteally around to the semimembranosus attachment.    I everted the patella and used calipers to measure patellar thickness.  I used the reamer to ream down to appropriate thickness to recreate the native thickness.  I then removed excess bone with the rongeur and sagittal saw.  I used the appropriately sized template and drilled the three lug holes.  I then put the trial in place and measured the thickness with the calipers to ensure recreation of the native thickness.  The trial was then removed and the patella subluxed and the knee brought into flexion.  A homan retractor was place to retract and protect the patella and lateral structures.  A Z-retractor was place medially to protect the medial structures.  The extra-medullary alignment system was used to make cut the tibial articular surface perpendicular to the anamotic axis of the tibia and in 3 degrees of posterior slope.  The cut surface and alignment jig was removed.  I then used the intramedullary alignment guide to make a 3 valgus cut on the distal femur.  I then marked out the epicondylar axis on the distal femur.  The posterior condylar axis measured 3 degrees.  I then used the anterior referencing sizer and measured the femur to be a size 9.  The 4-In-1 cutting block was screwed into place in external rotation matching the posterior condylar angle, making our cuts perpendicular to the epicondylar axis.  Anterior, posterior and chamfer cuts were made with the sagittal saw.  The cutting block and cut pieces were removed.  A  lamina spreader was placed in 90 degrees of flexion.  The ACL, PCL, menisci, and posterior condylar osteophytes were removed.  A 18 mm spacer blocked was found to offer good flexion and extension gap balance after mild in degree releasing.   The scoop retractor was then placed and the femoral finishing block was pinned in place.  The small sagittal saw was used as well as the lug drill to finish the femur.  The block  and cut surfaces were removed and the medullary canal hole filled with autograft bone from the cut pieces.  The tibia was delivered forward in deep flexion and external rotation.  A size E tray was selected and pinned into place centered on the medial 1/3 of the tibial tubercle.  The reamer and keel was used to prepare the tibia through the tray.    I then trialed with the size 9 femur, size E tibia, a 18 mm insert and the 35 patella.  I had excellent flexion/extension gap balance, excellent patella tracking.  Flexion was full and beyond 120 degrees; extension was zero.  These components were chosen and the staff opened them to me on the back table while the knee was lavaged copiously and the cement mixed.  The soft tissue was infiltrated with 60cc of exparel 1.3% through a 21 gauge needle.  I cemented in the components and removed all excess cement.  The polyethylene tibial component was snapped into place and the knee placed in extension while cement was hardening.  The capsule was infilltrated with 30cc of .25% Marcaine with epinephrine.  A hemovac was place in the joint exiting superolaterally.  A pain pump was place superomedially superficial to the arthrotomy.  Once the cement was hard, the tourniquet was let down.  Hemostasis was obtained.  The arthrotomy was closed with figure-8 #1 vicryl sutures.  The deep soft tissues were closed with #0 vicryls and the subcuticular layer closed with a running #2-0 vicryl.  The skin was reapproximated and closed with skin staples.  The wound was dressed with xeroform, 4 x4's, 2 ABD sponges, a single layer of webril and a TED stocking.   The patient was then awakened, extubated, and taken to the recovery room in stable condition.  BLOOD LOSS:  300cc DRAINS: 1 hemovac, 1 pain catheter COMPLICATIONS:  None.  PLAN OF CARE: Admit to inpatient   PATIENT DISPOSITION:  PACU - hemodynamically stable.   Delay start of Pharmacological VTE agent (>24hrs) due to  surgical blood loss or risk of bleeding:  not applicable  Please fax a copy of this op note to my office at [phone] (please only include page 1 and 2 of the Case Information op note)

## 2014-04-19 NOTE — Progress Notes (Signed)
SPORTS MEDICINE AND JOINT REPLACEMENT  Seth SpurlingStephen Lucey, MD   Seth CabalMaurice Sequoia Mincey, PA-C [address], [place], [place]  [id number]                             [phone]   PROGRESS NOTE  Subjective:  negative for Chest Pain  negative for Shortness of Breath  negative for Nausea/Vomiting   negative for Calf Pain  negative for Bowel Movement   Tolerating Diet: yes         Patient reports pain as 4 on 0-10 scale.    Objective: Vital signs in last 24 hours:   Patient Vitals for the past 24 hrs:  BP Temp Temp src Pulse Resp SpO2  04/19/14 0630 141/58 mmHg 98.3 F (36.8 C) Oral 55 - 95 %  04/18/14 2033 130/59 mmHg 97.5 F (36.4 C) Oral 64 - 93 %  04/18/14 1600 136/53 mmHg 98.1 F (36.7 C) Oral 57 18 98 %  04/18/14 1428 - - - - - 90 %  04/18/14 1200 131/60 mmHg 97.8 F (36.6 C) Oral 56 16 92 %  04/18/14 1025 - 97.2 F (36.2 C) - - - -  04/18/14 1019 133/50 mmHg - - 58 12 96 %  04/18/14 1004 133/49 mmHg - - 58 13 99 %  04/18/14 0949 133/54 mmHg - - 61 13 97 %  04/18/14 0934 137/58 mmHg - - 71 15 98 %  04/18/14 0933 - 97 F (36.1 C) - - - -    @flow {1959:LAST@   Intake/Output from previous day:   10/05 0701 - 10/06 0700 In: 3900 [P.O.:1800; I.V.:2100] Out: 1475 [Urine:1100; Drains:275]   Intake/Output this shift:       Intake/Output     10/05 0701 - 10/06 0700 10/06 0701 - 10/07 0700   P.O. 1800    I.V. (mL/kg) 2100 (21)    Total Intake(mL/kg) 3900 (39.1)    Urine (mL/kg/hr) 1100 (0.5)    Drains 275 (0.1)    Blood 100 (0)    Total Output 1475     Net +2425             LABORATORY DATA:  Recent Labs  04/18/14 1215 04/19/14 0527  WBC 7.2 8.7  HGB 11.5* 9.9*  HCT 35.4* 29.6*  PLT 213 233    Recent Labs  04/18/14 1215 04/19/14 0527  NA  --  135*  K  --  4.9  CL  --  97  CO2  --  25  BUN  --  30*  CREATININE 1.67* 1.85*  GLUCOSE  --  116*  CALCIUM  --  8.4   Lab Results  Component Value Date   INR 1.01 04/08/2014    Examination:  General  appearance: alert, cooperative and no distress Extremities: Homans sign is negative, no sign of DVT  Wound Exam: clean, dry, intact   Drainage:  None: wound tissue dry  Motor Exam: EHL and FHL Intact  Sensory Exam: Deep Peroneal normal   Assessment:    1 Day Post-Op  Procedure(s) (LRB): RIGHT TOTAL KNEE ARTHROPLASTY (Right)  ADDITIONAL DIAGNOSIS:  Active Problems:   S/P total knee arthroplasty  Acute Blood Loss Anemia   Plan: Physical Therapy as ordered Weight Bearing as Tolerated (WBAT)  DVT Prophylaxis:  Lovenox  DISCHARGE PLAN: Home  DISCHARGE NEEDS: HHPT, CPM, Walker and 3-in-1 comode seat         Seth Romero 04/19/2014, 7:21 AM

## 2014-04-19 NOTE — Progress Notes (Signed)
Physical Therapy Treatment Patient Details Name: Seth Romero MRN: [id number] DOB: 09/10/1940 Today's Date: 04/19/2014    History of Present Illness Pt s/p R TKA    PT Comments    Patient making progress with mobility and able to practice stairs this morning. Patient will be able to DC from a mobility standpoint. Will see again this afternoon as he is not planning to DC until PA returns to change dressing later today.   Follow Up Recommendations  Home health PT     Equipment Recommendations  None recommended by PT    Recommendations for Other Services       Precautions / Restrictions Precautions Precautions: Knee Restrictions RLE Weight Bearing: Weight bearing as tolerated    Mobility  Bed Mobility Overal bed mobility: Needs Assistance Bed Mobility: Supine to Sit     Supine to sit: Min assist     General bed mobility comments: min A for trunk support into sitting. Patient able to get RLE off of bed without assist  Transfers Overall transfer level: Needs assistance Equipment used: Rolling walker (2 wheeled) Transfers: Sit to/from Stand Sit to Stand: Min assist         General transfer comment: cues for hand placement and sequence. A to power up  Ambulation/Gait Ambulation/Gait assistance: Min guard Ambulation Distance (Feet): 200 Feet Assistive device: Rolling walker (2 wheeled) Gait Pattern/deviations: Step-through pattern;Decreased stride length   Gait velocity interpretation: Below normal speed for age/gender General Gait Details: Patient cued for management of RW and gait sequence. patient able to start ambulating with step through pattern   Stairs Stairs: Yes Stairs assistance: Min guard Stair Management: Step to pattern;Backwards;With walker;No rails Number of Stairs: 1 General stair comments: Patient able to practice one step backwards with cues for sequency and technique  Wheelchair Mobility    Modified Rankin (Stroke Patients Only)       Balance                                    Cognition Arousal/Alertness: Awake/alert Behavior During Therapy: WFL for tasks assessed/performed Overall Cognitive Status: Within Functional Limits for tasks assessed                      Exercises Total Joint Exercises Quad Sets: AROM;Right;10 reps Heel Slides: AROM;Right;10 reps Straight Leg Raises: AROM;Right;10 reps Long Arc Quad: AROM;Right;10 reps    General Comments        Pertinent Vitals/Pain Pain Assessment: No/denies pain    Home Living                      Prior Function            PT Goals (current goals can now be found in the care plan section) Progress towards PT goals: Progressing toward goals    Frequency  7X/week    PT Plan Current plan remains appropriate    Co-evaluation             End of Session Equipment Utilized During Treatment: Gait belt Activity Tolerance: Patient tolerated treatment well Patient left: in chair     Time: [id number] PT Time Calculation (min): 31 min  Charges:  $Gait Training: 8-22 mins $Therapeutic Exercise: 8-22 mins                    G Codes:      Robinette, Amil AmenJulia  Elizabeth 04/19/2014, 8:48 AM  04/19/2014 Fredrich Birks PTA [phone] pager [phone] office

## 2014-04-19 NOTE — Progress Notes (Signed)
Physical Therapy Treatment Patient Details Name: Seth Romero MRN: [id number] DOB: 09-Oct-1940 Today's Date: 04/19/2014    History of Present Illness Pt s/p R TKA    PT Comments    Patient progressing well. Patient safe to D/C from a mobility standpoint based on progression towards goals set on PT eval.    Follow Up Recommendations  Home health PT     Equipment Recommendations  None recommended by PT    Recommendations for Other Services       Precautions / Restrictions Precautions Precautions: Knee Restrictions RLE Weight Bearing: Weight bearing as tolerated    Mobility  Bed Mobility Overal bed mobility: Needs Assistance Bed Mobility: Supine to Sit     Supine to sit: Min assist     General bed mobility comments: min A for trunk support into sitting. Patient able to get RLE off of bed without assist  Transfers Overall transfer level: Needs assistance Equipment used: Rolling walker (2 wheeled) Transfers: Sit to/from Stand Sit to Stand: Supervision         General transfer comment: cues for hand placement  Ambulation/Gait Ambulation/Gait assistance: Supervision Ambulation Distance (Feet): 220 Feet Assistive device: Rolling walker (2 wheeled) Gait Pattern/deviations: Step-through pattern;Decreased stride length   Gait velocity interpretation: Below normal speed for age/gender General Gait Details: Cues for RW management   Stairs Stairs: Yes Stairs assistance: Min guard Stair Management: Step to pattern;Backwards;With walker;No rails Number of Stairs: 1 General stair comments: Patient able to practice one step backwards with cues for sequency and technique  Wheelchair Mobility    Modified Rankin (Stroke Patients Only)       Balance                                    Cognition Arousal/Alertness: Awake/alert Behavior During Therapy: WFL for tasks assessed/performed Overall Cognitive Status: Within Functional Limits for  tasks assessed                      Exercises Total Joint Exercises Quad Sets: AROM;Right;10 reps Heel Slides: AROM;Right;10 reps Straight Leg Raises: AROM;Right;10 reps Long Arc Quad: AROM;Right;10 reps    General Comments        Pertinent Vitals/Pain Pain Assessment: No/denies pain    Home Living                      Prior Function            PT Goals (current goals can now be found in the care plan section) Progress towards PT goals: Progressing toward goals    Frequency  7X/week    PT Plan Current plan remains appropriate    Co-evaluation             End of Session Equipment Utilized During Treatment: Gait belt Activity Tolerance: Patient tolerated treatment well Patient left: in chair;with family/visitor present     Time: 1130-1201 PT Time Calculation (min): 31 min  Charges:  $Gait Training: 8-22 mins $Therapeutic Exercise: 8-22 mins                    G Codes:      Fredrich BirksRobinette, Larah Kuntzman Elizabeth 04/19/2014, 12:14 PM 04/19/2014 Fredrich Birksobinette, Cloud Graham Elizabeth PTA [phone] pager [phone] office

## 2014-04-21 NOTE — Anesthesia Postprocedure Evaluation (Signed)
Anesthesia Post Note  Patient: Seth Romero  Procedure(s) Performed: Procedure(s) (LRB): RIGHT TOTAL KNEE ARTHROPLASTY (Right)  Anesthesia type: General  Patient location: PACU  Post pain: Pain level controlled and Adequate analgesia  Post assessment: Post-op Vital signs reviewed, Patient's Cardiovascular Status Stable, Respiratory Function Stable, Patent Airway and Pain level controlled  Last Vitals:  Filed Vitals:   04/19/14 1411  BP:   Pulse: 58  Temp: 36.7 C  Resp: 17    Post vital signs: Reviewed and stable  Level of consciousness: awake, alert  and oriented  Complications: No apparent anesthesia complications

## 2014-04-21 NOTE — Discharge Summary (Signed)
SPORTS MEDICINE & JOINT REPLACEMENT   Georgena SpurlingStephen Lucey, MD   Altamese CabalMaurice Deryck Hippler, PA-C [address], [place], [place]  [id number]                             [phone]  PATIENT ID: Seth Romero Stogner        MRN:  [id number]          DOB/AGE: 02-13-1941 / 73 y.o.    DISCHARGE SUMMARY  ADMISSION DATE:    04/18/2014 DISCHARGE DATE:   04/19/2014  ADMISSION DIAGNOSIS: osteoarthritis right knee    DISCHARGE DIAGNOSIS:  osteoarthritis right knee    ADDITIONAL DIAGNOSIS: Active Problems:   S/P total knee arthroplasty  Past Medical History  Diagnosis Date  . Hypertension   . Asthma   . COPD (chronic obstructive pulmonary disease)   . Diabetes mellitus without complication   . Chronic kidney disease   . Arthritis   . Glaucoma   . Kidney disease, chronic, stage III (GFR 30-59 ml/min)     [place]  . Full dentures   . Urination frequency   . GERD (gastroesophageal reflux disease)   . Shortness of breath   . History of pneumonia     10 years ago  . Diabetes mellitus type 2, noninsulin dependent     x 10 years  . Hearing impaired person     wears hearing aides  . History of kidney stones 1995    PROCEDURE: Procedure(s): RIGHT TOTAL KNEE ARTHROPLASTY on 04/18/2014  CONSULTS:     HISTORY:  See H&P in chart  HOSPITAL COURSE:  Seth Romero Vanvranken is a 73 y.o. admitted on 04/18/2014 and found to have a diagnosis of osteoarthritis right knee.  After appropriate laboratory studies were obtained  they were taken to the operating room on 04/18/2014 and underwent Procedure(s): RIGHT TOTAL KNEE ARTHROPLASTY.   They were given perioperative antibiotics:  Anti-infectives   Start     Dose/Rate Route Frequency Ordered Stop   04/18/14 1330  ceFAZolin (ANCEF) IVPB 1 g/50 mL premix     1 g 100 mL/hr over 30 Minutes Intravenous Every 6 hours 04/18/14 1058 04/18/14 2022   04/18/14 0600  ceFAZolin (ANCEF) IVPB 2 g/50 mL premix     2 g 100 mL/hr over 30 Minutes  Intravenous On call to O.R. 04/17/14 1253 04/18/14 0746    .  Tolerated the procedure well.  Placed with a foley intraoperatively.  Given Ofirmev at induction and for 48 hours.    POD# 1: Vital signs were stable.  Patient denied Chest pain, shortness of breath, or calf pain.  Patient was started on Lovenox 30 mg subcutaneously twice daily at 8am.  Consults to PT, OT, and care management were made.  The patient was weight bearing as tolerated.  CPM was placed on the operative leg 0-90 degrees for 6-8 hours a day.  Incentive spirometry was taught.  Dressing was changed.  Hemovac was discontinued.      POD #2, Continued  PT for ambulation and exercise program.  IV saline locked.  O2 discontinued.    The remainder of the hospital course was dedicated to ambulation and strengthening.   The patient was discharged on 1 day post op in  Good condition.  Blood products given:none  DIAGNOSTIC STUDIES: Recent vital signs: No data found.      Recent laboratory studies:  Recent Labs  04/18/14 1215 04/19/14 0527  WBC 7.2 8.7  HGB 11.5* 9.9*  HCT 35.4* 29.6*  PLT 213 233    Recent Labs  04/18/14 1215 04/19/14 0527  NA  --  135*  K  --  4.9  CL  --  97  CO2  --  25  BUN  --  30*  CREATININE 1.67* 1.85*  GLUCOSE  --  116*  CALCIUM  --  8.4   Lab Results  Component Value Date   INR 1.01 04/08/2014     Recent Radiographic Studies :  Dg Chest 2 View  04/08/2014   CLINICAL DATA:  Preoperative knee replacement; hypertension  EXAM: CHEST  2 VIEW  COMPARISON:  None.  FINDINGS: There is underlying emphysematous change. There is no edema or consolidation. Heart size and pulmonary vascularity are normal. No adenopathy. There is atherosclerotic change in the aorta. No adenopathy. No bone lesions.  IMPRESSION: Evidence of a degree of underlying emphysematous change. No edema or consolidation. Atherosclerotic change in aorta.   Electronically Signed   By: Bretta Bang M.D.   On: 04/08/2014  12:52    DISCHARGE INSTRUCTIONS: Discharge Instructions   CPM    Complete by:  As directed   Continuous passive motion machine (CPM):      Use the CPM from 0 to 90 for 6-8 hours per day.      You may increase by 10 per day.  You may break it up into 2 or 3 sessions per day.      Use CPM for 2 weeks or until you are told to stop.     Call MD / Call 911    Complete by:  As directed   If you experience chest pain or shortness of breath, CALL 911 and be transported to the hospital emergency room.  If you develope a fever above 101 F, pus (white drainage) or increased drainage or redness at the wound, or calf pain, call your surgeon's office.     Change dressing    Complete by:  As directed   Change dressing on wednesday, then change the dressing daily with sterile 4 x 4 inch gauze dressing and apply TED hose.     Constipation Prevention    Complete by:  As directed   Drink plenty of fluids.  Prune juice may be helpful.  You may use a stool softener, such as Colace (over the counter) 100 mg twice a day.  Use MiraLax (over the counter) for constipation as needed.     Diet - low sodium heart healthy    Complete by:  As directed      Do not put a pillow under the knee. Place it under the heel.    Complete by:  As directed      Driving restrictions    Complete by:  As directed   No driving for 6 weeks     Increase activity slowly as tolerated    Complete by:  As directed      Lifting restrictions    Complete by:  As directed   No lifting for 6 weeks     TED hose    Complete by:  As directed   Use stockings (TED hose) for 2 weeks on both leg(s).  You may remove them at night for sleeping.           DISCHARGE MEDICATIONS:     Medication List    STOP taking these medications       traMADol 50  MG tablet  Commonly known as:  ULTRAM      TAKE these medications       albuterol 108 (90 BASE) MCG/ACT inhaler  Commonly known as:  PROVENTIL HFA;VENTOLIN HFA  Inhale 1 puff into the  lungs every 6 (six) hours as needed for wheezing or shortness of breath.     amLODipine 10 MG tablet  Commonly known as:  NORVASC  Take 10 mg by mouth daily.     aspirin EC 325 MG tablet  Take 1 tablet (325 mg total) by mouth daily.     cholecalciferol 1000 UNITS tablet  Commonly known as:  VITAMIN D  Take 2,000 Units by mouth daily.     Fluticasone-Salmeterol 250-50 MCG/DOSE Aepb  Commonly known as:  ADVAIR  Inhale 1 puff into the lungs daily.     furosemide 40 MG tablet  Commonly known as:  LASIX  Take 40 mg by mouth daily.     glipiZIDE-metformin 5-500 MG per tablet  Commonly known as:  METAGLIP  Take 2 tablets by mouth 2 (two) times daily before a meal.     hydrALAZINE 25 MG tablet  Commonly known as:  APRESOLINE  Take 25 mg by mouth 2 (two) times daily.     losartan 50 MG tablet  Commonly known as:  COZAAR  Take 50 mg by mouth daily.     magnesium oxide 400 MG tablet  Commonly known as:  MAG-OX  Take 400 mg by mouth 2 (two) times daily.     methocarbamol 500 MG tablet  Commonly known as:  ROBAXIN  Take 1-2 tablets (500-1,000 mg total) by mouth every 6 (six) hours as needed for muscle spasms.     metoprolol 50 MG tablet  Commonly known as:  LOPRESSOR  Take 50 mg by mouth 2 (two) times daily.     montelukast 10 MG tablet  Commonly known as:  SINGULAIR  Take 10 mg by mouth at bedtime.     omeprazole 20 MG capsule  Commonly known as:  PRILOSEC  Take 20 mg by mouth daily.     oxyCODONE 5 MG immediate release tablet  Commonly known as:  Oxy IR/ROXICODONE  Take 1-2 tablets (5-10 mg total) by mouth every 3 (three) hours as needed for breakthrough pain or severe pain.     SIMBRINZA 1-0.2 % Susp  Generic drug:  Brinzolamide-Brimonidine  Place 1 drop into both eyes 3 (three) times daily.        FOLLOW UP VISIT:       Follow-up Information   Follow up with [facility]. (Someone from [facility] will contact you  concerning start date and time for physical therapy.Marland Kitchen)    Contact information:   [facility] [address] [place] [address] [phone]       Follow up with Raymon Mutton, MD. Call on 05/03/2014.   Specialty:  Orthopedic Surgery   Contact information:   [address] [place] [address] [phone]       DISPOSITION: HOME   CONDITION:  Good   Alonah Lineback 04/21/2014, 8:54 AM

## 2015-11-03 IMAGING — CR DG CHEST 2V
2 series · 2 of 2 positions shown · non-contrast
Comparison: None.

CLINICAL DATA: Preoperative knee replacement; hypertension

EXAM:
CHEST  2 VIEW

[w chest pa]
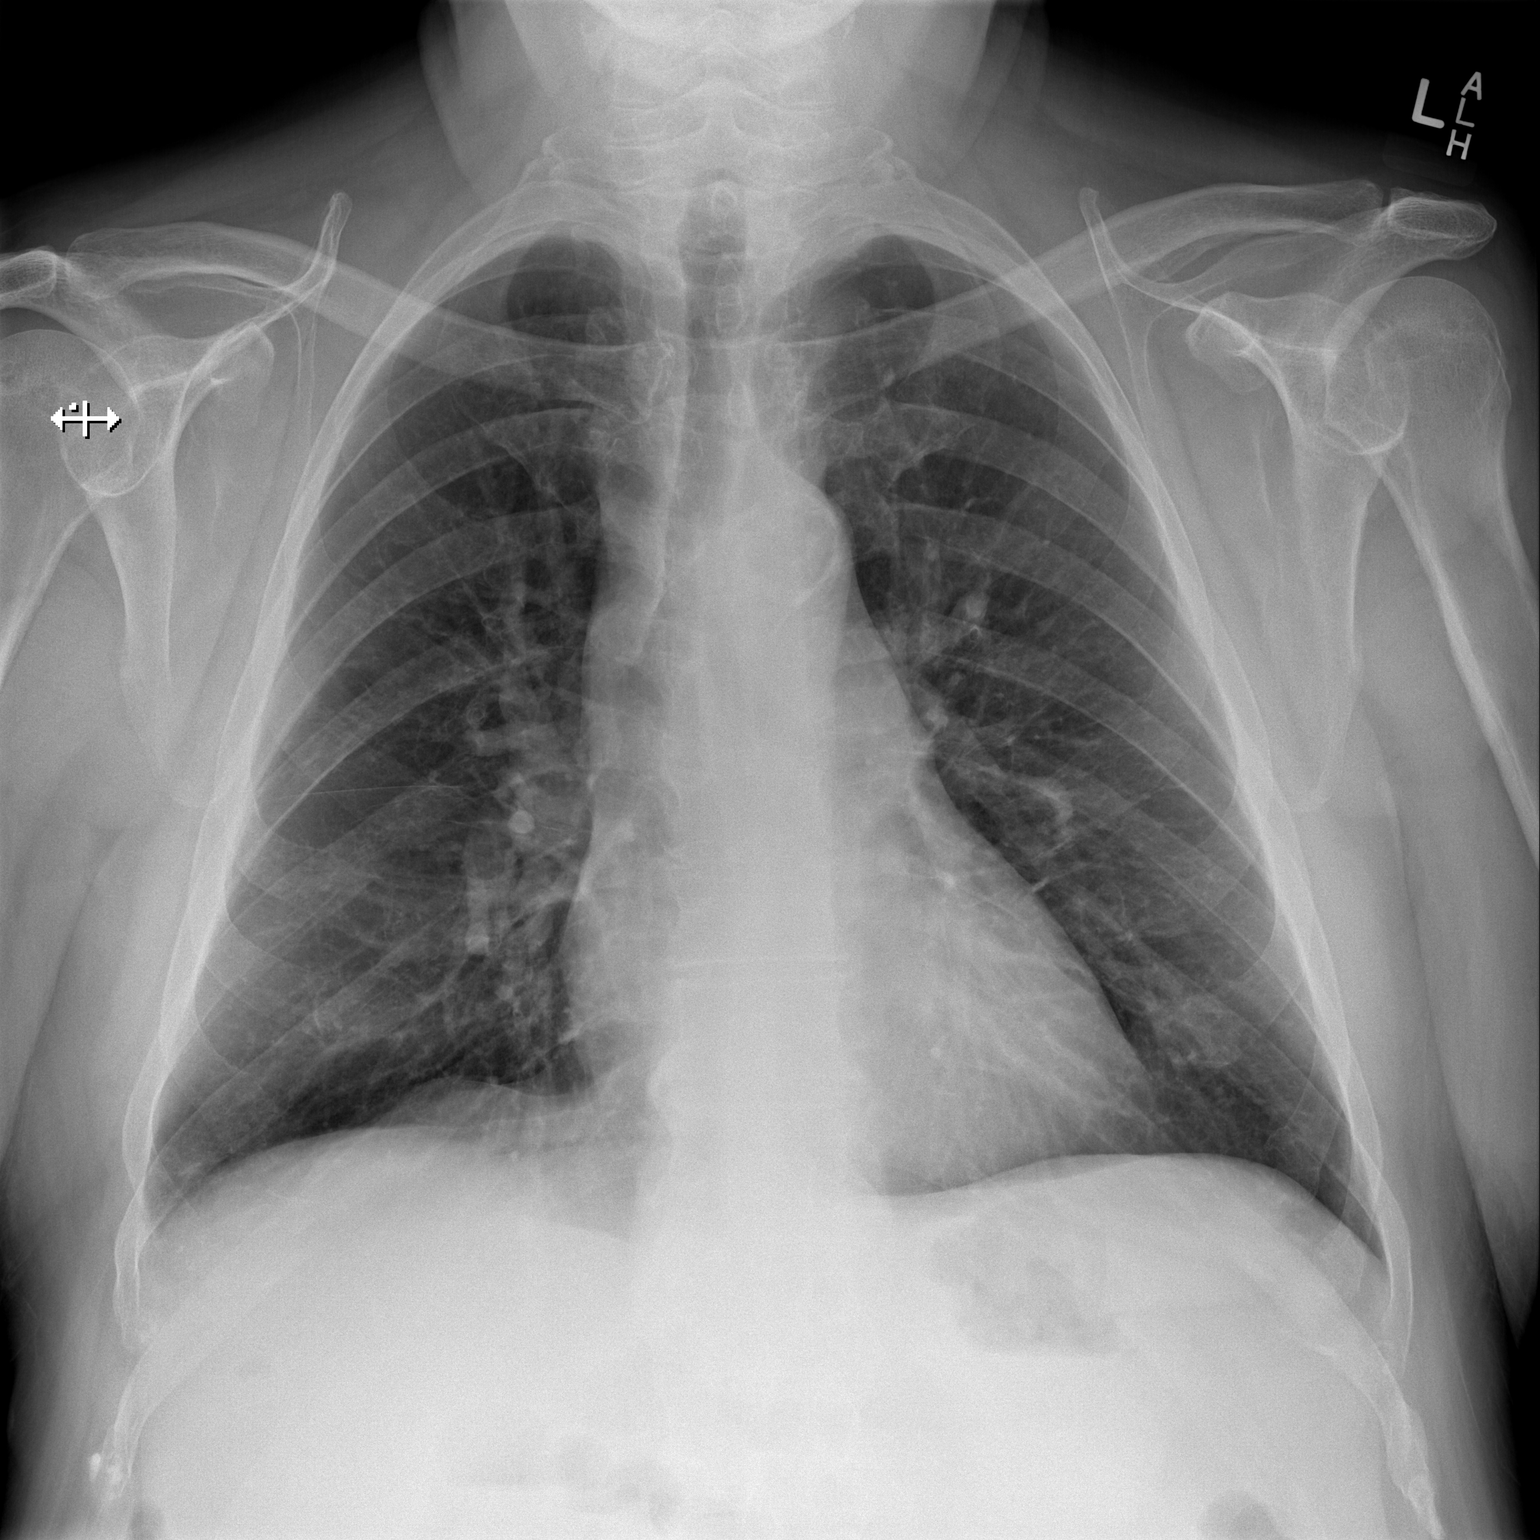

[w chest lat]
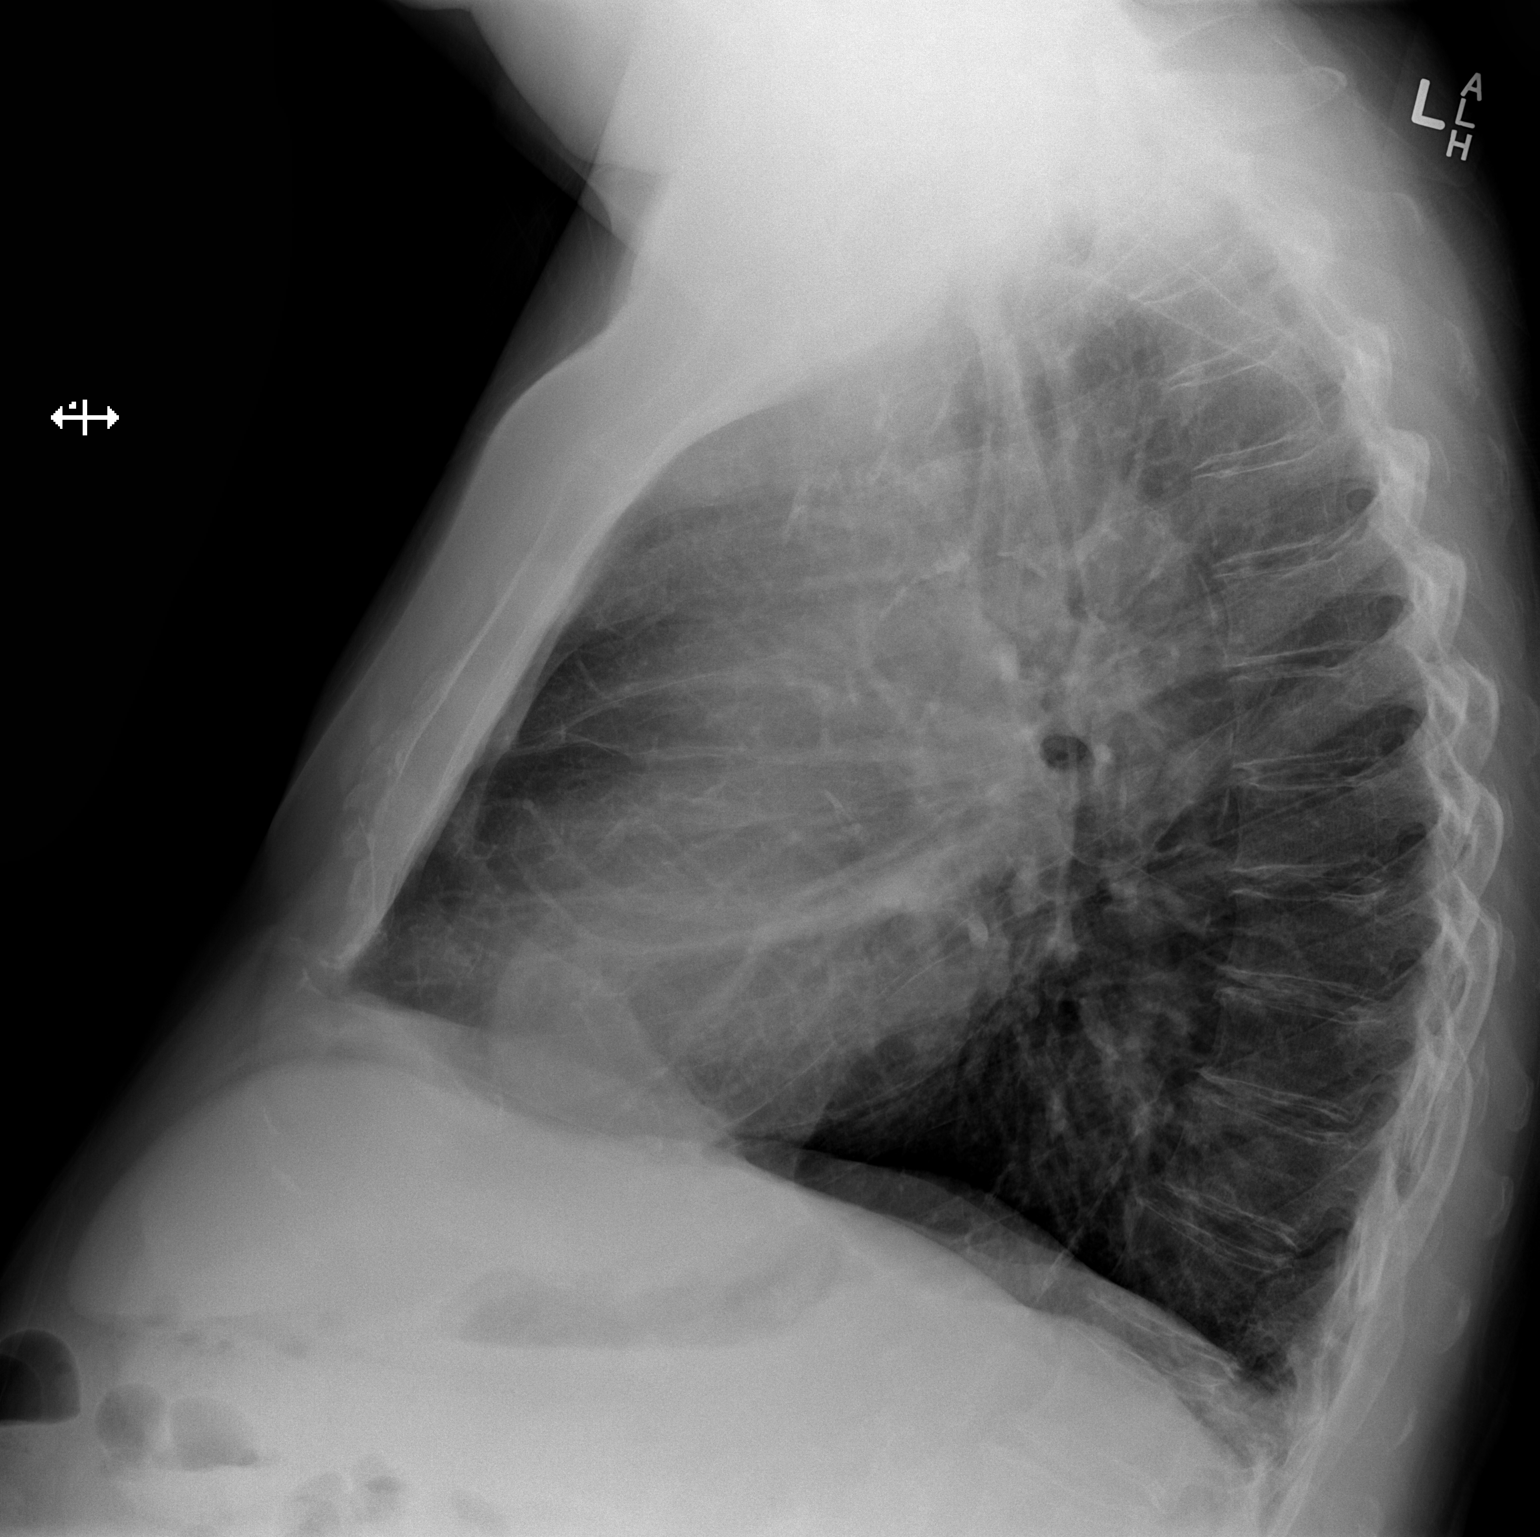

[2 of 2 positions shown; findings below may reference images not displayed]

FINDINGS: There is underlying emphysematous change. There is no edema or
consolidation. Heart size and pulmonary vascularity are normal. No
adenopathy. There is atherosclerotic change in the aorta. No
adenopathy. No bone lesions.
IMPRESSION: Evidence of a degree of underlying emphysematous change. No edema or
consolidation. Atherosclerotic change in aorta.

## 2018-10-14 DEATH — deceased
